# Patient Record
Sex: Female | Born: 1990 | Race: White | Hispanic: No | Marital: Married | Smoking: Never smoker
Health system: Southern US, Community
[De-identification: ages and names within clinical notes are randomized; demographics above are authoritative.]

## PROBLEM LIST (undated history)

## (undated) ENCOUNTER — Inpatient Hospital Stay (HOSPITAL_COMMUNITY): Payer: Self-pay

## (undated) ENCOUNTER — Ambulatory Visit (HOSPITAL_BASED_OUTPATIENT_CLINIC_OR_DEPARTMENT_OTHER): Payer: Self-pay

## (undated) DIAGNOSIS — Z789 Other specified health status: Secondary | ICD-10-CM

## (undated) HISTORY — DX: Other specified health status: Z78.9

## (undated) HISTORY — PX: OTHER SURGICAL HISTORY: SHX169

---

## 2014-04-08 DIAGNOSIS — O4590 Premature separation of placenta, unspecified, unspecified trimester: Secondary | ICD-10-CM

## 2015-12-12 ENCOUNTER — Telehealth: Payer: Self-pay

## 2015-12-12 NOTE — Telephone Encounter (Signed)
Patient is a new ob scheduled on 12-19-15 and called our office with some questions. Attempted to reach patient and no answer- left message for patient to return call to our office. Armandina StammerJennifer Lynnzie Blackson RN BSN

## 2015-12-12 NOTE — Telephone Encounter (Signed)
-  atient called and states she is pregnant. LMP01-19-17 (approx 9 weeks)  Patient states she had some light spotting on Friday and Saturday that was pink in color. Patient stated she did have some more spotting yesterday but it was more brownish in color.01- Patient-instructed to go to maternity admissions unit if bleeding continues for evaluation. Patient states understanding and has new ob appointment on Monday 12-19-15 in our clinic. Armandina StammerJennifer Nolberto Cheuvront RN BSN

## 2015-12-19 ENCOUNTER — Other Ambulatory Visit (HOSPITAL_COMMUNITY)
Admission: RE | Admit: 2015-12-19 | Discharge: 2015-12-19 | Disposition: A | Discharge status: Home / Self Care | Payer: Medicaid Other | Source: Ambulatory Visit | Attending: Obstetrics & Gynecology | Admitting: Obstetrics & Gynecology

## 2015-12-19 ENCOUNTER — Encounter: Payer: Self-pay | Admitting: Obstetrics & Gynecology

## 2015-12-19 ENCOUNTER — Ambulatory Visit (INDEPENDENT_AMBULATORY_CARE_PROVIDER_SITE_OTHER): Payer: Medicaid Other | Admitting: Obstetrics & Gynecology

## 2015-12-19 VITALS — BP 129/74 | HR 74 | Ht 68.0 in | Wt 170.0 lb

## 2015-12-19 DIAGNOSIS — O0991 Supervision of high risk pregnancy, unspecified, first trimester: Secondary | ICD-10-CM | POA: Diagnosis not present

## 2015-12-19 DIAGNOSIS — O0990 Supervision of high risk pregnancy, unspecified, unspecified trimester: Secondary | ICD-10-CM | POA: Insufficient documentation

## 2015-12-19 DIAGNOSIS — Z113 Encounter for screening for infections with a predominantly sexual mode of transmission: Secondary | ICD-10-CM | POA: Diagnosis present

## 2015-12-19 DIAGNOSIS — Z1151 Encounter for screening for human papillomavirus (HPV): Secondary | ICD-10-CM | POA: Diagnosis not present

## 2015-12-19 DIAGNOSIS — O34219 Maternal care for unspecified type scar from previous cesarean delivery: Secondary | ICD-10-CM | POA: Insufficient documentation

## 2015-12-19 DIAGNOSIS — Z01411 Encounter for gynecological examination (general) (routine) with abnormal findings: Secondary | ICD-10-CM | POA: Insufficient documentation

## 2015-12-19 DIAGNOSIS — Z36 Encounter for antenatal screening of mother: Secondary | ICD-10-CM

## 2015-12-19 DIAGNOSIS — O09299 Supervision of pregnancy with other poor reproductive or obstetric history, unspecified trimester: Secondary | ICD-10-CM | POA: Insufficient documentation

## 2015-12-19 DIAGNOSIS — O09291 Supervision of pregnancy with other poor reproductive or obstetric history, first trimester: Secondary | ICD-10-CM

## 2015-12-19 NOTE — Progress Notes (Signed)
  Subjective:    Erin BertholdChristina [name] is being seen today for her first obstetrical visit.  This is not a planned pregnancy. She is at 1262w4d gestation based on her LMP.  By Erin [name] (today she is 7 5/[redacted] weeks gestation. Her obstetrical history is significant for h/o placental abruption. . Relationship with FOB: pt planning to get married this weekend.  . Patient does intend to breast feed. Pregnancy history fully reviewed.  Patient reports spotting 2-3 days ago.  Light..  Review of Systems:   Review of Systems  Objective:     BP 129/74 mmHg  Pulse 74  Ht 5\' 8"  (1.727 m)  Wt 170 lb (77.111 kg)  BMI 25.85 kg/m2  LMP 10/06/2015 Physical Exam  Exam General Appearance:    Alert, cooperative, no distress, appears stated age  Head:    Normocephalic, without obvious abnormality, atraumatic  Eyes:    conjunctiva/corneas clear, EOM's intact, both eyes  Ears:    Normal external ear canals, both ears  Nose:   Nares normal, septum midline, mucosa normal, no drainage    or sinus tenderness  Throat:   Lips, mucosa, and tongue normal; teeth and gums normal  Neck:   Supple, symmetrical, trachea midline, no adenopathy;    thyroid:  no enlargement/tenderness/nodules  Back:     Symmetric, no curvature, ROM normal, no CVA tenderness  Lungs:     Clear to auscultation bilaterally, respirations unlabored  Chest Wall:    No tenderness or deformity   Heart:    Regular rate and rhythm, S1 and S2 normal, no murmur, rub   or gallop  Breast Exam:    No tenderness, masses, or nipple abnormality  Abdomen:     Soft, non-tender, bowel sounds active all four quadrants,    no masses, no organomegaly  Genitalia:    Normal female without lesion, discharge or tenderness; 8 weeks sized uterus     Extremities:   Extremities normal, atraumatic, no cyanosis or edema  Pulses:   2+ and symmetric all extremities  Skin:   Skin color, texture, turgor normal, no rashes or lesions      Assessment:   IUP at 7 5/[redacted] weeks  gestation Patient Active Problem List   Diagnosis Date Noted  . Supervision of high risk pregnancy, antepartum 12/19/2015  . Previous cesarean delivery, antepartum 12/19/2015  . Neonatal death in prior pregnancy, currently pregnant 12/19/2015      Plan:     Initial labs drawn. Prenatal vitamins. Problem list reviewed and updated. First trimester screening requested and ordered AFP3 discussed: requested.  Role of ultrasound in pregnancy discussed; fetal survey: requested. Amniocentesis discussed: not indicated. Follow up in 5 weeks. 50% of 40 min visit spent on counseling and coordination of care.  Need records for pt and autopsy info for [place] (1st child that died at 30 min of life)   [name], Erin Fariss 12/19/2015

## 2015-12-19 NOTE — Progress Notes (Signed)
Bedside ultrasound revealed 7 week 5 day pregnancy [facility] FHR on ultrasound is 164 bpm. Armandina StammerJennifer Howard RN BSN

## 2015-12-19 NOTE — Patient Instructions (Signed)
Vaginal Bleeding During Pregnancy, First Trimester °A small amount of bleeding (spotting) from the vagina is relatively common in early pregnancy. It usually stops on its own. Various things may cause bleeding or spotting in early pregnancy. Some bleeding may be related to the pregnancy, and some may not. In most cases, the bleeding is normal and is not a problem. However, bleeding can also be a sign of something serious. Be sure to tell your health care provider about any vaginal bleeding right away. °Some possible causes of vaginal bleeding during the first trimester include: °· Infection or inflammation of the cervix. °· Growths (polyps) on the cervix. °· Miscarriage or threatened miscarriage. °· Pregnancy tissue has developed outside of the uterus and in a fallopian tube (tubal pregnancy). °· Tiny cysts have developed in the uterus instead of pregnancy tissue (molar pregnancy). °HOME CARE INSTRUCTIONS  °Watch your condition for any changes. The following actions may help to lessen any discomfort you are feeling: °· Follow your health care provider's instructions for limiting your activity. If your health care provider orders bed rest, you may need to stay in bed and only get up to use the bathroom. However, your health care provider may allow you to continue light activity. °· If needed, make plans for someone to help with your regular activities and responsibilities while you are on bed rest. °· Keep track of the number of pads you use each day, how often you change pads, and how soaked (saturated) they are. Write this down. °· Do not use tampons. Do not douche. °· Do not have sexual intercourse or orgasms until approved by your health care provider. °· If you pass any tissue from your vagina, save the tissue so you can show it to your health care provider. °· Only take over-the-counter or prescription medicines as directed by your health care provider. °· Do not take aspirin because it can make you  bleed. °· Keep all follow-up appointments as directed by your health care provider. °SEEK MEDICAL CARE IF: °· You have any vaginal bleeding during any part of your pregnancy. °· You have cramps or labor pains. °· You have a fever, not controlled by medicine. °SEEK IMMEDIATE MEDICAL CARE IF:  °· You have severe cramps in your back or belly (abdomen). °· You pass large clots or tissue from your vagina. °· Your bleeding increases. °· You feel light-headed or weak, or you have fainting episodes. °· You have chills. °· You are leaking fluid or have a gush of fluid from your vagina. °· You pass out while having a bowel movement. °MAKE SURE YOU: °· Understand these instructions. °· Will watch your condition. °· Will get help right away if you are not doing well or get worse. °  °This information is not intended to replace advice given to you by your health care provider. Make sure you discuss any questions you have with your health care provider. °  °Document Released: 06/13/2005 Document Revised: 09/08/2013 Document Reviewed: 05/11/2013 °Elsevier Interactive Patient Education ©2016 Else[employer] ° ° °First Trimester of Pregnancy °The first trimester of pregnancy is from week 1 until the end of week 12 (months 1 through 3). A week after a sperm fertilizes an egg, the egg will implant on the wall of the uterus. This embryo will begin to develop into a baby. Genes from you and your partner are forming the baby. The female genes determine whether the baby is a boy or a girl. At 6-8 weeks, the eyes and   formed, and the heartbeat can be seen on ultrasound. At the end of 12 weeks, all the baby's organs are formed.  Now that you are pregnant, you will want to do everything you can to have a healthy baby. Two of the most important things are to get good prenatal care and to follow your health care provider's instructions. Prenatal care is all the medical care you receive before the baby's birth. This care will help prevent,  find, and treat any problems during the pregnancy and childbirth. BODY CHANGES Your body goes through many changes during pregnancy. The changes vary from woman to woman.   You may gain or lose a couple of pounds at first.  You may feel sick to your stomach (nauseous) and throw up (vomit). If the vomiting is uncontrollable, call your health care provider.  You may tire easily.  You may develop headaches that can be relieved by medicines approved by your health care provider.  You may urinate more often. Painful urination may mean you have a bladder infection.  You may develop heartburn as a result of your pregnancy.  You may develop constipation because certain hormones are causing the muscles that push waste through your intestines to slow down.  You may develop hemorrhoids or swollen, bulging veins (varicose veins).  Your breasts may begin to grow larger and become tender. Your nipples may stick out more, and the tissue that surrounds them (areola) may become darker.  Your gums may bleed and may be sensitive to brushing and flossing.  Dark spots or blotches (chloasma, mask of pregnancy) may develop on your face. This will likely fade after the baby is born.  Your menstrual periods will stop.  You may have a loss of appetite.  You may develop cravings for certain kinds of food.  You may have changes in your emotions from day to day, such as being excited to be pregnant or being concerned that something may go wrong with the pregnancy and baby.  You may have more vivid and strange dreams.  You may have changes in your hair. These can include thickening of your hair, rapid growth, and changes in texture. Some women also have hair loss during or after pregnancy, or hair that feels dry or thin. Your hair will most likely return to normal after your baby is born. WHAT TO EXPECT AT YOUR PRENATAL VISITS During a routine prenatal visit:  You will be weighed to make sure you and the  baby are growing normally.  Your blood pressure will be taken.  Your abdomen will be measured to track your baby's growth.  The fetal heartbeat will be listened to starting around week 10 or 12 of your pregnancy.  Test results from any previous visits will be discussed. Your health care provider may ask you:  How you are feeling.  If you are feeling the baby move.  If you have had any abnormal symptoms, such as leaking fluid, bleeding, severe headaches, or abdominal cramping.  If you are using any tobacco products, including cigarettes, chewing tobacco, and electronic cigarettes.  If you have any questions. Other tests that may be performed during your first trimester include:  Blood tests to find your blood type and to check for the presence of any previous infections. They will also be used to check for low iron levels (anemia) and Rh antibodies. Later in the pregnancy, blood tests for diabetes will be done along with other tests if problems develop.  Urine tests to check  to check for infections, diabetes, or protein in the urine. °· An ultrasound to confirm the proper growth and development of the baby. °· An amniocentesis to check for possible genetic problems. °· Fetal screens for spina bifida and Down syndrome. °· You may need other tests to make sure you and the baby are doing well. °· HIV (human immunodeficiency virus) testing. Routine prenatal testing includes screening for HIV, unless you choose not to have this test. °HOME CARE INSTRUCTIONS  °Medicines °· Follow your health care provider's instructions regarding medicine use. Specific medicines may be either safe or unsafe to take during pregnancy. °· Take your prenatal vitamins as directed. °· If you develop constipation, try taking a stool softener if your health care provider approves. °Diet °· Eat regular, well-balanced meals. Choose a variety of foods, such as meat or vegetable-based protein, fish, milk and low-fat dairy products,  vegetables, fruits, and whole grain breads and cereals. Your health care provider will help you determine the amount of weight gain that is right for you. °· Avoid raw meat and uncooked cheese. These carry germs that can cause birth defects in the baby. °· Eating four or five small meals rather than three large meals a day may help relieve nausea and vomiting. If you start to feel nauseous, eating a few soda crackers can be helpful. Drinking liquids between meals instead of during meals also seems to help nausea and vomiting. °· If you develop constipation, eat more high-fiber foods, such as fresh vegetables or fruit and whole grains. Drink enough fluids to keep your urine clear or pale yellow. °Activity and Exercise °· Exercise only as directed by your health care provider. Exercising will help you: °¨ Control your weight. °¨ Stay in shape. °¨ Be prepared for labor and delivery. °· Experiencing pain or cramping in the lower abdomen or low back is a good sign that you should stop exercising. Check with your health care provider before continuing normal exercises. °· Try to avoid standing for long periods of time. Move your legs often if you must stand in one place for a long time. °· Avoid heavy lifting. °· Wear low-heeled shoes, and practice good posture. °· You may continue to have sex unless your health care provider directs you otherwise. °Relief of Pain or Discomfort °· Wear a good support bra for breast tenderness.   °· Take warm sitz baths to soothe any pain or discomfort caused by hemorrhoids. Use hemorrhoid cream if your health care provider approves.   °· Rest with your legs elevated if you have leg cramps or low back pain. °· If you develop varicose veins in your legs, wear support hose. Elevate your feet for 15 minutes, 3-4 times a day. Limit salt in your diet. °Prenatal Care °· Schedule your prenatal visits by the twelfth week of pregnancy. They are usually scheduled monthly at first, then more often in  the last 2 months before delivery. °· Write down your questions. Take them to your prenatal visits. °· Keep all your prenatal visits as directed by your health care provider. °Safety °· Wear your seat belt at all times when driving. °· Make a list of emergency phone numbers, including numbers for family, friends, the hospital, and police and fire departments. °General Tips °· Ask your health care provider for a referral to a local prenatal education class. Begin classes no later than at the beginning of month 6 of your pregnancy. °· Ask for help if you have counseling or nutritional needs during pregnancy.   Your health care provider can offer advice or refer you to specialists for help with various needs. °· Do not use hot tubs, steam rooms, or saunas. °· Do not douche or use tampons or scented sanitary pads. °· Do not cross your legs for long periods of time. °· Avoid cat litter boxes and soil used by cats. These carry germs that can cause birth defects in the baby and possibly loss of the fetus by miscarriage or stillbirth. °· Avoid all smoking, herbs, alcohol, and medicines not prescribed by your health care provider. Chemicals in these affect the formation and growth of the baby. °· Do not use any tobacco products, including cigarettes, chewing tobacco, and electronic cigarettes. If you need help quitting, ask your health care provider. You may receive counseling support and other resources to help you quit. °· Schedule a dentist appointment. At home, brush your teeth with a soft toothbrush and be gentle when you floss. °SEEK MEDICAL CARE IF:  °· You have dizziness. °· You have mild pelvic cramps, pelvic pressure, or nagging pain in the abdominal area. °· You have persistent nausea, vomiting, or diarrhea. °· You have a bad smelling vaginal discharge. °· You have pain with urination. °· You notice increased swelling in your face, hands, legs, or ankles. °SEEK IMMEDIATE MEDICAL CARE IF:  °· You have a fever. °· You  are leaking fluid from your vagina. °· You have spotting or bleeding from your vagina. °· You have severe abdominal cramping or pain. °· You have rapid weight gain or loss. °· You vomit blood or material that looks like coffee grounds. °· You are exposed to German measles and have never had them. °· You are exposed to fifth disease or chickenpox. °· You develop a severe headache. °· You have shortness of breath. °· You have any kind of trauma, such as from a fall or a car accident. °  °This information is not intended to replace advice given to you by your health care provider. Make sure you discuss any questions you have with your health care provider. °  °Document Released: 08/28/2001 Document Revised: 09/24/2014 Document Reviewed: 07/14/2013 °Elsevier Interactive Patient Education ©2016 Else[employer] Inc. ° °

## 2015-12-19 NOTE — Progress Notes (Signed)
Patient presents for New OB visit.  

## 2015-12-20 ENCOUNTER — Encounter: Payer: Self-pay | Admitting: Obstetrics & Gynecology

## 2015-12-20 DIAGNOSIS — O26899 Other specified pregnancy related conditions, unspecified trimester: Secondary | ICD-10-CM | POA: Insufficient documentation

## 2015-12-20 DIAGNOSIS — Z6791 Unspecified blood type, Rh negative: Secondary | ICD-10-CM

## 2015-12-20 LAB — OBSTETRIC PANEL
Antibody Screen: NEGATIVE
BASOS ABS: 0 {cells}/uL (ref 0–200)
BASOS PCT: 0 %
EOS ABS: 59 {cells}/uL (ref 15–500)
Eosinophils Relative: 1 %
HCT: 39.2 % (ref 35.0–45.0)
HEMOGLOBIN: 12.9 g/dL (ref 11.7–15.5)
HEP B S AG: NEGATIVE
LYMPHS ABS: 2537 {cells}/uL (ref 850–3900)
Lymphocytes Relative: 43 %
MCH: 28.6 pg (ref 27.0–33.0)
MCHC: 32.9 g/dL (ref 32.0–36.0)
MCV: 86.9 fL (ref 80.0–100.0)
MONO ABS: 472 {cells}/uL (ref 200–950)
MPV: 9.6 fL (ref 7.5–12.5)
Monocytes Relative: 8 %
NEUTROS ABS: 2832 {cells}/uL (ref 1500–7800)
Neutrophils Relative %: 48 %
Platelets: 264 10*3/uL (ref 140–400)
RBC: 4.51 MIL/uL (ref 3.80–5.10)
RDW: 14.2 % (ref 11.0–15.0)
RUBELLA: 1.31 {index} — AB (ref <0.90)
Rh Type: NEGATIVE
WBC: 5.9 10*3/uL (ref 3.8–10.8)

## 2015-12-20 LAB — HIV ANTIBODY (ROUTINE TESTING W REFLEX): HIV: NONREACTIVE

## 2015-12-20 LAB — CULTURE, URINE COMPREHENSIVE
Colony Count: NO GROWTH
Organism ID, Bacteria: NO GROWTH

## 2015-12-21 LAB — CYTOLOGY - PAP

## 2015-12-27 LAB — CYSTIC FIBROSIS DIAGNOSTIC STUDY

## 2016-01-13 ENCOUNTER — Encounter (HOSPITAL_COMMUNITY): Payer: Self-pay | Admitting: Obstetrics & Gynecology

## 2016-01-19 ENCOUNTER — Telehealth: Payer: Self-pay | Admitting: Family Medicine

## 2016-01-19 NOTE — Telephone Encounter (Signed)
[facility] [place] [facility] here [facility], She is calling questioning why she have this appt and why do she have to come here if [facility] is [facility]

## 2016-01-23 ENCOUNTER — Ambulatory Visit (INDEPENDENT_AMBULATORY_CARE_PROVIDER_SITE_OTHER): Payer: Medicaid Other | Admitting: Obstetrics & Gynecology

## 2016-01-23 ENCOUNTER — Ambulatory Visit: Payer: Medicaid Other | Admitting: Obstetrics & Gynecology

## 2016-01-23 VITALS — BP 129/67 | HR 77 | Wt 172.0 lb

## 2016-01-23 VITALS — Wt 172.1 lb

## 2016-01-23 DIAGNOSIS — IMO0002 Reserved for concepts with insufficient information to code with codable children: Secondary | ICD-10-CM

## 2016-01-23 DIAGNOSIS — O0991 Supervision of high risk pregnancy, unspecified, first trimester: Secondary | ICD-10-CM

## 2016-01-23 NOTE — Progress Notes (Signed)
Patient ID: Erin [name], female   DOB: [birth year], 25 y.o.   MRN: [id number] Subjective:scheduled for colposcopy  Erin [name] is a 25 y.o. G2P1000 at 5530w5d being seen today for ongoing prenatal care.  She is currently monitored for the following issues for this high-risk pregnancy and has Supervision of high risk pregnancy, antepartum; Previous cesarean delivery, antepartum; Neonatal death in prior pregnancy, currently pregnant; Rh negative state in antepartum period; and ASCUS with positive high risk HPV on her problem list.  Patient reports no complaints.   .  .   . Denies leaking of fluid.   The following portions of the patient's history were reviewed and updated as appropriate: allergies, current medications, past family history, past medical history, past social history, past surgical history and problem list. Problem list updated.  Objective:   Filed Vitals:   01/23/16 1446  Weight: 172 lb 1.6 oz (78.064 kg)    Fetal Status:           General:  Alert, oriented and cooperative. Patient is in no acute distress.  Skin: Skin is warm and dry. No rash noted.   Cardiovascular: Normal heart rate noted  Respiratory: Normal respiratory effort, no problems with respiration noted  Abdomen: Soft, gravid, appropriate for gestational age.       Pelvic:       Cervical exam deferred        Extremities: Normal range of motion.     Mental Status: Normal mood and affect. Normal behavior. Normal judgment and thought content.   Urinalysis:      Assessment and Plan:  Pregnancy: G2P1000 at 5230w5d  1. ASCUS with positive high risk HPV Repeat pap and cotesting at 12 mo  2. Supervision of high risk pregnancy, antepartum, first trimester Has first screen scheduled  Preterm labor symptoms and general obstetric precautions including but not limited to vaginal bleeding, contractions, leaking of fluid and fetal movement were reviewed in detail with the patient. Please refer to After Visit  Summary for other counseling recommendations.  Return in about 4 weeks (around 02/20/2016).   Adam PhenixJames G Arnold, MD

## 2016-01-23 NOTE — Telephone Encounter (Signed)
Spoke with patient who [facility]. She stated no one has called her to inform her of this information. I discuss with patient the procedure would consist of and recommended she called [place] to discuss moving her appt. Pt verbalize understanding and will follow up with the [facility].

## 2016-01-23 NOTE — Patient Instructions (Signed)

## 2016-01-25 ENCOUNTER — Encounter: Payer: Medicaid Other | Admitting: Family Medicine

## 2016-01-27 ENCOUNTER — Ambulatory Visit (HOSPITAL_COMMUNITY): Admission: RE | Admit: 2016-01-27 | Payer: Medicaid Other | Source: Ambulatory Visit

## 2016-01-27 ENCOUNTER — Encounter (HOSPITAL_COMMUNITY): Payer: Self-pay

## 2016-01-27 ENCOUNTER — Ambulatory Visit (HOSPITAL_COMMUNITY)
Admission: RE | Admit: 2016-01-27 | Discharge: 2016-01-27 | Disposition: A | Discharge status: Home / Self Care | Payer: Medicaid Other | Source: Ambulatory Visit | Attending: Obstetrics & Gynecology | Admitting: Obstetrics & Gynecology

## 2016-01-27 VITALS — BP 126/64 | HR 68 | Wt 175.4 lb

## 2016-01-27 DIAGNOSIS — O09291 Supervision of pregnancy with other poor reproductive or obstetric history, first trimester: Secondary | ICD-10-CM | POA: Diagnosis not present

## 2016-01-27 DIAGNOSIS — O34219 Maternal care for unspecified type scar from previous cesarean delivery: Secondary | ICD-10-CM | POA: Diagnosis not present

## 2016-01-27 DIAGNOSIS — Z36 Encounter for antenatal screening of mother: Secondary | ICD-10-CM | POA: Insufficient documentation

## 2016-01-27 DIAGNOSIS — O0991 Supervision of high risk pregnancy, unspecified, first trimester: Secondary | ICD-10-CM

## 2016-01-27 DIAGNOSIS — Z3A13 13 weeks gestation of pregnancy: Secondary | ICD-10-CM | POA: Diagnosis not present

## 2016-01-30 ENCOUNTER — Other Ambulatory Visit (HOSPITAL_COMMUNITY): Payer: Self-pay | Admitting: *Deleted

## 2016-01-30 DIAGNOSIS — O09299 Supervision of pregnancy with other poor reproductive or obstetric history, unspecified trimester: Secondary | ICD-10-CM

## 2016-02-21 ENCOUNTER — Ambulatory Visit (INDEPENDENT_AMBULATORY_CARE_PROVIDER_SITE_OTHER): Payer: PRIVATE HEALTH INSURANCE | Admitting: Student

## 2016-02-21 VITALS — BP 118/74 | HR 64 | Wt 177.0 lb

## 2016-02-21 DIAGNOSIS — O0992 Supervision of high risk pregnancy, unspecified, second trimester: Secondary | ICD-10-CM

## 2016-02-21 DIAGNOSIS — O34219 Maternal care for unspecified type scar from previous cesarean delivery: Secondary | ICD-10-CM

## 2016-02-21 LAB — POCT URINALYSIS DIP (DEVICE)
Bilirubin Urine: NEGATIVE
GLUCOSE, UA: NEGATIVE mg/dL
HGB URINE DIPSTICK: NEGATIVE
Ketones, ur: NEGATIVE mg/dL
LEUKOCYTES UA: NEGATIVE
NITRITE: NEGATIVE
PROTEIN: NEGATIVE mg/dL
Specific Gravity, Urine: 1.025 (ref 1.005–1.030)
UROBILINOGEN UA: 0.2 mg/dL (ref 0.0–1.0)
pH: 6 (ref 5.0–8.0)

## 2016-02-21 LAB — CBC
HCT: 37.2 % (ref 35.0–45.0)
Hemoglobin: 12.5 g/dL (ref 11.7–15.5)
MCH: 29.3 pg (ref 27.0–33.0)
MCHC: 33.6 g/dL (ref 32.0–36.0)
MCV: 87.3 fL (ref 80.0–100.0)
MPV: 9.8 fL (ref 7.5–12.5)
PLATELETS: 232 10*3/uL (ref 140–400)
RBC: 4.26 MIL/uL (ref 3.80–5.10)
RDW: 13.6 % (ref 11.0–15.0)
WBC: 7.6 10*3/uL (ref 3.8–10.8)

## 2016-02-21 NOTE — Progress Notes (Signed)
Pt reports episodes of feeling very hot and then she gets faint. She has not fainted just feels as though she will.

## 2016-02-22 ENCOUNTER — Telehealth: Payer: Self-pay | Admitting: *Deleted

## 2016-02-22 NOTE — Telephone Encounter (Signed)
Called patient and left message that I'm calling with results.  

## 2016-02-22 NOTE — Progress Notes (Signed)
Subjective:  Erin [name] is a 25 y.o. G2P1000 at 625w0d being seen today for ongoing prenatal care.  She is currently monitored for the following issues for this high-risk pregnancy and has Supervision of high risk pregnancy, antepartum; Previous cesarean delivery, antepartum; Neonatal death in prior pregnancy, currently pregnant; Rh negative state in antepartum period; and ASCUS with positive high risk HPV on her problem list.  Patient reports presyncopal episodes. States she occaionally feels like things are about to go black. Episodes are preceded by whole body feeling hot. Denies syncope. Symtpoms improve if she sits down and only last a few seconds at a time. Denies hx of this prior to pregnancy; denies hx of seizures, cardiac issues, or hypoglycemia. .  Contractions: Not present. Vag. Bleeding: None.  Movement: Absent. Denies leaking of fluid.   The following portions of the patient's history were reviewed and updated as appropriate: allergies, current medications, past family history, past medical history, past social history, past surgical history and problem list. Problem list updated.  Objective:   Filed Vitals:   02/21/16 1537  BP: 118/74  Pulse: 64  Weight: 177 lb (80.287 kg)    Fetal Status: Fetal Heart Rate (bpm): 150 Fundal Height: 18 cm Movement: Absent     General:  Alert, oriented and cooperative. Patient is in no acute distress.  Skin: Skin is warm and dry. No rash noted.   Cardiovascular: Normal heart rate noted  Respiratory: Normal respiratory effort, no problems with respiration noted  Abdomen: Soft, gravid, appropriate for gestational age. Pain/Pressure: Absent     Pelvic: Vag. Bleeding: None     Cervical exam deferred        Extremities: Normal range of motion.  Edema: None  Mental Status: Normal mood and affect. Normal behavior. Normal judgment and thought content.   Urinalysis:      Assessment and Plan:  Pregnancy: G2P1000 at 635w0d  1. Supervision of  high risk pregnancy, antepartum, second trimester  - CBC  Preterm labor symptoms and general obstetric precautions including but not limited to vaginal bleeding, contractions, leaking of fluid and fetal movement were reviewed in detail with the patient. Discussed eating small meals every 3 hours to maintain blood sugars. If symptoms worsen or she does pass out, patient knows purpose of MAU.  Please refer to After Visit Summary for other counseling recommendations.  No Follow-up on file.   Judeth HornErin Chapel Silver[place]orn, NP

## 2016-02-22 NOTE — Telephone Encounter (Signed)
-----   Message from Judeth HornErin Lawrence, NP sent at 02/22/2016  8:08 AM EDT ----- Please inform patient her hemoglobin is stable & she's not anemic. Discussed with her interventions to help with her symptoms. If things get worse or she does pass out she should go to MAU. Thanks!

## 2016-02-22 NOTE — Patient Instructions (Signed)

## 2016-02-23 ENCOUNTER — Telehealth: Payer: Self-pay | Admitting: General Practice

## 2016-02-23 ENCOUNTER — Encounter: Payer: Self-pay | Admitting: General Practice

## 2016-02-23 NOTE — Telephone Encounter (Signed)
Patient called into front office & I informed her of results. Patient verbalized understanding & had no questions

## 2016-02-23 NOTE — Telephone Encounter (Signed)
Called patient, no answer- left message stating we are trying to reach you with non urgent results, please call us back at the clinics. Will send letter 

## 2016-03-09 ENCOUNTER — Encounter (HOSPITAL_COMMUNITY): Payer: Self-pay

## 2016-03-09 ENCOUNTER — Other Ambulatory Visit (HOSPITAL_COMMUNITY): Payer: Self-pay | Admitting: Maternal and Fetal Medicine

## 2016-03-09 ENCOUNTER — Ambulatory Visit (HOSPITAL_COMMUNITY)
Admission: RE | Admit: 2016-03-09 | Discharge: 2016-03-09 | Disposition: A | Discharge status: Home / Self Care | Payer: Medicaid Other | Source: Ambulatory Visit | Attending: Obstetrics & Gynecology | Admitting: Obstetrics & Gynecology

## 2016-03-09 VITALS — BP 115/62 | HR 64 | Wt 179.4 lb

## 2016-03-09 DIAGNOSIS — O09292 Supervision of pregnancy with other poor reproductive or obstetric history, second trimester: Secondary | ICD-10-CM | POA: Insufficient documentation

## 2016-03-09 DIAGNOSIS — O34219 Maternal care for unspecified type scar from previous cesarean delivery: Secondary | ICD-10-CM | POA: Diagnosis not present

## 2016-03-09 DIAGNOSIS — Z3689 Encounter for other specified antenatal screening: Secondary | ICD-10-CM

## 2016-03-09 DIAGNOSIS — Z36 Encounter for antenatal screening of mother: Secondary | ICD-10-CM | POA: Insufficient documentation

## 2016-03-09 DIAGNOSIS — Z3A19 19 weeks gestation of pregnancy: Secondary | ICD-10-CM | POA: Insufficient documentation

## 2016-03-09 DIAGNOSIS — O09299 Supervision of pregnancy with other poor reproductive or obstetric history, unspecified trimester: Secondary | ICD-10-CM

## 2016-03-09 DIAGNOSIS — O0990 Supervision of high risk pregnancy, unspecified, unspecified trimester: Secondary | ICD-10-CM

## 2016-03-22 ENCOUNTER — Ambulatory Visit (INDEPENDENT_AMBULATORY_CARE_PROVIDER_SITE_OTHER): Payer: Medicaid Other | Admitting: Obstetrics & Gynecology

## 2016-03-22 VITALS — BP 134/80 | HR 70 | Wt 187.0 lb

## 2016-03-22 DIAGNOSIS — O34219 Maternal care for unspecified type scar from previous cesarean delivery: Secondary | ICD-10-CM | POA: Diagnosis not present

## 2016-03-22 DIAGNOSIS — O0992 Supervision of high risk pregnancy, unspecified, second trimester: Secondary | ICD-10-CM | POA: Diagnosis present

## 2016-03-22 LAB — POCT URINALYSIS DIP (DEVICE)
Bilirubin Urine: NEGATIVE
Glucose, UA: NEGATIVE mg/dL
Ketones, ur: NEGATIVE mg/dL
Nitrite: NEGATIVE
PH: 6 (ref 5.0–8.0)
PROTEIN: NEGATIVE mg/dL
SPECIFIC GRAVITY, URINE: 1.02 (ref 1.005–1.030)
UROBILINOGEN UA: 0.2 mg/dL (ref 0.0–1.0)

## 2016-03-22 NOTE — Progress Notes (Signed)
  Subjective:  Erin BertholdChristina [name] is a 25 y.o. G2P1000 at 7424w1d being seen today for ongoing prenatal care.  She is currently monitored for the following issues for this low-risk pregnancy and has Supervision of high risk pregnancy, antepartum; Previous cesarean delivery, antepartum; Neonatal death in prior pregnancy, currently pregnant; Rh negative state in antepartum period; and ASCUS with positive high risk HPV on her problem list.  Patient reports no contractions.  Contractions: Not present. Vag. Bleeding: None.  Movement: Present. Denies leaking of fluid.   The following portions of the patient's history were reviewed and updated as appropriate: allergies, current medications, past family history, past medical history, past social history, past surgical history and problem list. Problem list updated.  Objective:   Filed Vitals:   03/22/16 0804  BP: 134/80  Pulse: 70  Weight: 187 lb (84.823 kg)    Fetal Status: Fetal Heart Rate (bpm): 152 Fundal Height: 21 cm Movement: Present     General:  Alert, oriented and cooperative. Patient is in no acute distress.  Skin: Skin is warm and dry. No rash noted.   Cardiovascular: Normal heart rate noted  Respiratory: Normal respiratory effort, no problems with respiration noted  Abdomen: Soft, gravid, appropriate for gestational age. Pain/Pressure: Present     Pelvic:  Cervical exam deferred        Extremities: Normal range of motion.  Edema: None  Mental Status: Normal mood and affect. Normal behavior. Normal judgment and thought content.   Urinalysis: Urine Protein: Negative Urine Glucose: Negative  Assessment and Plan:  Pregnancy: G2P1000 at 5424w1d  1. Supervision of high risk pregnancy, antepartum, second trimester H/o neonatal death  Preterm labor symptoms and general obstetric precautions including but not limited to vaginal bleeding, contractions, leaking of fluid and fetal movement were reviewed in detail with the patient. Please  refer to After Visit Summary for other counseling recommendations.  Return in about 4 weeks (around 04/19/2016).   Adam PhenixJames G Arnold, MD

## 2016-03-22 NOTE — Patient Instructions (Signed)

## 2016-03-31 ENCOUNTER — Inpatient Hospital Stay (HOSPITAL_COMMUNITY)
Admission: AD | Admit: 2016-03-31 | Discharge: 2016-03-31 | Disposition: A | Discharge status: Home / Self Care | Payer: Medicaid Other | Source: Ambulatory Visit | Attending: Obstetrics & Gynecology | Admitting: Obstetrics & Gynecology

## 2016-03-31 ENCOUNTER — Encounter (HOSPITAL_COMMUNITY): Payer: Self-pay | Admitting: *Deleted

## 2016-03-31 DIAGNOSIS — Z882 Allergy status to sulfonamides status: Secondary | ICD-10-CM | POA: Diagnosis not present

## 2016-03-31 DIAGNOSIS — Z3A22 22 weeks gestation of pregnancy: Secondary | ICD-10-CM | POA: Diagnosis not present

## 2016-03-31 DIAGNOSIS — N898 Other specified noninflammatory disorders of vagina: Secondary | ICD-10-CM | POA: Insufficient documentation

## 2016-03-31 DIAGNOSIS — O26892 Other specified pregnancy related conditions, second trimester: Secondary | ICD-10-CM | POA: Insufficient documentation

## 2016-03-31 DIAGNOSIS — O34219 Maternal care for unspecified type scar from previous cesarean delivery: Secondary | ICD-10-CM

## 2016-03-31 LAB — WET PREP, GENITAL
CLUE CELLS WET PREP: NONE SEEN
Sperm: NONE SEEN
TRICH WET PREP: NONE SEEN
YEAST WET PREP: NONE SEEN

## 2016-03-31 LAB — URINALYSIS, ROUTINE W REFLEX MICROSCOPIC
BILIRUBIN URINE: NEGATIVE
Glucose, UA: NEGATIVE mg/dL
Hgb urine dipstick: NEGATIVE
KETONES UR: NEGATIVE mg/dL
LEUKOCYTES UA: NEGATIVE
NITRITE: NEGATIVE
PH: 6.5 (ref 5.0–8.0)
PROTEIN: NEGATIVE mg/dL
Specific Gravity, Urine: <1.005 — ABNORMAL LOW (ref 1.005–1.030)

## 2016-03-31 LAB — POCT FERN TEST: POCT FERN TEST: NEGATIVE

## 2016-03-31 NOTE — Discharge Instructions (Signed)
Premature Rupture and Preterm Premature Rupture of Membranes °Premature rupture of membranes (PROM) is when the membranes (amniotic sac) break open before contractions or labor starts. Rupture of membranes is commonly referred to as your water breaking. If PROM occurs before 37 weeks of pregnancy, it is called preterm premature rupture of membranes (PPROM). The amniotic sac holds the fetus, keeps infection out, and performs other important functions. Having the amniotic sac rupture before 37 weeks of pregnancy can lead to serious problems and requires immediate attention by your health care provider. °CAUSES  °PROM near the end of the pregnancy may be caused by natural weakening of the membranes. PPROM is often due to an infection. Other factors that may be associated with PROM include: °· Stretching of the amniotic sac because of carrying multiples or having too much amniotic fluid. °· Trauma. °· Smoking during pregnancy. °· Poor nutrition. °· Previous preterm birth. °· Vaginal bleeding. °· Little to no prenatal care. °· Problems with the placenta, such as placenta previa or placental abruption. °RISKS OF PROM AND PPROM °· Delivering a premature baby. °· Getting a serious infection of the placental tissues (chorioamnionitis). °· Early detachment of the placenta from the uterus (placental abruption). °· Compression of the umbilical cord. °· Needing a cesarean birth. °· Developing a serious infection after delivery. °SIGNS OF PROM OR PPROM  °· A sudden gush or slow leaking of fluid from the vagina. °· Constant wet underwear. °Sometimes, women mistake the leaking or wetness for urine, especially if the leak is slow and not a gush of fluid. If there is constant leaking or your underwear continues to get wet, your membranes have likely ruptured. °WHAT TO DO IF YOU THINK YOUR MEMBRANES HAVE RUPTURED °Call your health care provider right away. You will need to go to the hospital to get checked immediately. °WHAT HAPPENS  IF YOU ARE DIAGNOSED WITH PROM OR PPROM? °Once you arrive at the hospital, you will have tests done. A cervical exam will be performed to check if the cervix has softened or started to open (dilate). If you are diagnosed with PROM, you may be induced within 24 hours if you are not having contractions. If you are diagnosed with PPROM and are not having contractions, you may be induced depending on your trimester.  °If you have PPROM, you: °· And your baby will be monitored closely for signs of infection or other complications. °· May be given an antibiotic medicine to lower the chances of an infection developing. °· May be given a steroid medicine to help mature the baby's lungs faster. °· May be given a medicine to stop preterm labor. °· May be ordered to be on bed rest at home or in the hospital. °· May be induced if complications arise for you or the baby. °Your treatment will depend on many factors, such as how far along you are, the development of the baby, and other complications that may arise. °  °This information is not intended to replace advice given to you by your health care provider. Make sure you discuss any questions you have with your health care provider. °  °Document Released: 09/03/2005 Document Revised: 06/24/2013 Document Reviewed: 12/23/2012 °Elsevier Interactive Patient Education ©2016 Else[employer] Inc[employer] ° °

## 2016-03-31 NOTE — MAU Provider Note (Signed)
MAU HISTORY AND PHYSICAL  Chief Complaint:  Leakage of fluid  Erin [name] is a 25 y.o.  G2P1000 with IUP at 324w3d presenting for the above.  Has had increased discharge for few weeks but yesterday and today much more. "almost like i've peed myself." enough to soak through pants. No bleeding. No contractions. No fevers. No dysuria. No hematuria. No burning or itching or pain. No recent sex.  History reviewed. No pertinent past medical history.  Past Surgical History  Procedure Laterality Date  . Cesarean section    . Egg donor x2       History reviewed. No pertinent family history.  Social History  Substance Use Topics  . Smoking status: Never Smoker   . Smokeless tobacco: Never Used  . Alcohol Use: No    Allergies  Allergen Reactions  . Sulfa Antibiotics     Prescriptions prior to admission  Medication Sig Dispense Refill Last Dose  . prenatal vitamin w/FE, FA (PRENATAL 1 + 1) 27-1 MG TABS tablet Take 1 tablet by mouth daily at 12 noon.   Taking    Review of Systems - Negative except for what is mentioned in HPI.  Physical Exam  Temperature 98.5 F (36.9 C), temperature source Oral, last menstrual period 10/06/2015. GENERAL: Well-developed, well-nourished female in no acute distress.  LUNGS: Clear to auscultation bilaterally.  HEART: Regular rate and rhythm. ABDOMEN: Soft, nontender, nondistended, gravid.  EXTREMITIES: Nontender, no edema, 2+ distal pulses. GU: white thick discharge, normal cervix, no pooling, no fluid exiting the external os with valsalva FHT:  150s   Labs: Results for orders placed or performed during the hospital encounter of 03/31/16 (from the past 24 hour(s))  Urinalysis, Routine w reflex microscopic (not [facility])   Collection Time: 03/31/16  1:34 PM  Result Value Ref Range   Color, Urine YELLOW YELLOW   APPearance CLEAR CLEAR   Specific Gravity, Urine <1.005 (L) 1.005 - 1.030   pH 6.5 5.0 - 8.0   Glucose, UA NEGATIVE NEGATIVE mg/dL    Hgb urine dipstick NEGATIVE NEGATIVE   Bilirubin Urine NEGATIVE NEGATIVE   Ketones, ur NEGATIVE NEGATIVE mg/dL   Protein, ur NEGATIVE NEGATIVE mg/dL   Nitrite NEGATIVE NEGATIVE   Leukocytes, UA NEGATIVE NEGATIVE  Wet prep, genital   Collection Time: 03/31/16  1:50 PM  Result Value Ref Range   Yeast Wet Prep HPF POC NONE SEEN NONE SEEN   Trich, Wet Prep NONE SEEN NONE SEEN   Clue Cells Wet Prep HPF POC NONE SEEN NONE SEEN   WBC, Wet Prep HPF POC FEW (A) NONE SEEN   Sperm NONE SEEN     Imaging Studies:  [place] [facility] Ob Detail +14 Wk  03/13/2016  OBSTETRICAL ULTRASOUND: This exam was performed within a [facility] [facility]. The OB US report was generated in the AS system, and faxed to the ordering physician.  This report is available in the [facility] PACS. See the [facility] Obstetric US report via the Image Link.   Assessment: Erin [name] is  25 y.o. G2P1000 at 354w3d presents with leakage of fluid. History not strongly suggestive of pprom. Exam not suggestive of pprom, and fern negative. Wet prep also unremarkable, as was urinalysis. G/c pending. Likely leukorrhea. FHTs wnl  Plan: - f/u g, c - pprom return precautions  Silvano Bilisoah B Alisen Marsiglia 7/15/20172:39 PM

## 2016-03-31 NOTE — MAU Note (Signed)
Increased d/c thinks water is broken

## 2016-04-02 LAB — GC/CHLAMYDIA PROBE AMP (~~LOC~~) NOT AT ARMC
Chlamydia: NEGATIVE
NEISSERIA GONORRHEA: NEGATIVE

## 2016-04-24 ENCOUNTER — Ambulatory Visit (INDEPENDENT_AMBULATORY_CARE_PROVIDER_SITE_OTHER): Payer: Medicaid Other | Admitting: Medical

## 2016-04-24 VITALS — BP 125/89 | HR 72 | Wt 193.0 lb

## 2016-04-24 DIAGNOSIS — O0992 Supervision of high risk pregnancy, unspecified, second trimester: Secondary | ICD-10-CM | POA: Diagnosis not present

## 2016-04-24 LAB — POCT URINALYSIS DIP (DEVICE)
BILIRUBIN URINE: NEGATIVE
Glucose, UA: NEGATIVE mg/dL
Hgb urine dipstick: NEGATIVE
Ketones, ur: NEGATIVE mg/dL
Leukocytes, UA: NEGATIVE
NITRITE: NEGATIVE
PH: 6.5 (ref 5.0–8.0)
PROTEIN: NEGATIVE mg/dL
Specific Gravity, Urine: 1.02 (ref 1.005–1.030)
Urobilinogen, UA: 0.2 mg/dL (ref 0.0–1.0)

## 2016-04-24 NOTE — Patient Instructions (Addendum)
Second Trimester of Pregnancy The second trimester is from week 13 through week 28, month 4 through 6. This is often the time in pregnancy that you feel your best. Often times, morning sickness has lessened or quit. You may have more energy, and you may get hungry more often. Your unborn baby (fetus) is growing rapidly. At the end of the sixth month, he or she is about 9 inches long and weighs about 1 pounds. You will likely feel the baby move (quickening) between 18 and 20 weeks of pregnancy. HOME CARE   Avoid all smoking, herbs, and alcohol. Avoid drugs not approved by your doctor.  Do not use any tobacco products, including cigarettes, chewing tobacco, and electronic cigarettes. If you need help quitting, ask your doctor. You may get counseling or other support to help you quit.  Only take medicine as told by your doctor. Some medicines are safe and some are not during pregnancy.  Exercise only as told by your doctor. Stop exercising if you start having cramps.  Eat regular, healthy meals.  Wear a good support bra if your breasts are tender.  Do not use hot tubs, steam rooms, or saunas.  Wear your seat belt when driving.  Avoid raw meat, uncooked cheese, and liter boxes and soil used by cats.  Take your prenatal vitamins.  Take 1500-2000 milligrams of calcium daily starting at the 20th week of pregnancy until you deliver your baby.  Try taking medicine that helps you poop (stool softener) as needed, and if your doctor approves. Eat more fiber by eating fresh fruit, vegetables, and whole grains. Drink enough fluids to keep your pee (urine) clear or pale yellow.  Take warm water baths (sitz baths) to soothe pain or discomfort caused by hemorrhoids. Use hemorrhoid cream if your doctor approves.  If you have puffy, bulging veins (varicose veins), wear support hose. Raise (elevate) your feet for 15 minutes, 3-4 times a day. Limit salt in your diet.  Avoid heavy lifting, wear low heals,  and sit up straight.  Rest with your legs raised if you have leg cramps or low back pain.  Visit your dentist if you have not gone during your pregnancy. Use a soft toothbrush to brush your teeth. Be gentle when you floss.  You can have sex (intercourse) unless your doctor tells you not to.  Go to your doctor visits. GET HELP IF:   You feel dizzy.  You have mild cramps or pressure in your lower belly (abdomen).  You have a nagging pain in your belly area.  You continue to feel sick to your stomach (nauseous), throw up (vomit), or have watery poop (diarrhea).  You have bad smelling fluid coming from your vagina.  You have pain with peeing (urination). GET HELP RIGHT AWAY IF:   You have a fever.  You are leaking fluid from your vagina.  You have spotting or bleeding from your vagina.  You have severe belly cramping or pain.  You lose or gain weight rapidly.  You have trouble catching your breath and have chest pain.  You notice sudden or extreme puffiness (swelling) of your face, hands, ankles, feet, or legs.  You have not felt the baby move in over an hour.  You have severe headaches that do not go away with medicine.  You have vision changes.   This information is not intended to reBraxton Hicks Contractions Contractions of the uterus can occur throughout pregnancy. Contractions are not always a sign that you are  in labor.  WHAT ARE BRAXTON HICKS CONTRACTIONS?  Contractions that occur before labor are called Braxton Hicks contractions, or false labor. Toward the end of pregnancy (32-34 weeks), these contractions can develop more often and may become more forceful. This is not true labor because these contractions do not result in opening (dilatation) and thinning of the cervix. They are sometimes difficult to tell apart from true labor because these contractions can be forceful and people have different pain tolerances. You should not feel embarrassed if you go to the  hospital with false labor. Sometimes, the only way to tell if you are in true labor is for your health care provider to look for changes in the cervix. If there are no prenatal problems or other health problems associated with the pregnancy, it is completely safe to be sent home with false labor and await the onset of true labor. HOW CAN YOU TELL THE DIFFERENCE BETWEEN TRUE AND FALSE LABOR? False Labor  The contractions of false labor are usually shorter and not as hard as those of true labor.   The contractions are usually irregular.   The contractions are often felt in the front of the lower abdomen and in the groin.   The contractions may go away when you walk around or change positions while lying down.   The contractions get weaker and are shorter lasting as time goes on.   The contractions do not usually become progressively stronger, regular, and closer together as with true labor.  True Labor  Contractions in true labor last 30-70 seconds, become very regular, usually become more intense, and increase in frequency.   The contractions do not go away with walking.   The discomfort is usually felt in the top of the uterus and spreads to the lower abdomen and low back.   True labor can be determined by your health care provider with an exam. This will show that the cervix is dilating and getting thinner.  WHAT TO REMEMBER  Keep up with your usual exercises and follow other instructions given by your health care provider.   Take medicines as directed by your health care provider.   Keep your regular prenatal appointments.   Eat and drink lightly if you think you are going into labor.   If Braxton Hicks contractions are making you uncomfortable:   Change your position from lying down or resting to walking, or from walking to resting.   Sit and rest in a tub of warm water.   Drink 2-3 glasses of water. Dehydration may cause these contractions.   Do slow  and deep breathing several times an hour.  WHEN SHOULD I SEEK IMMEDIATE MEDICAL CARE? Seek immediate medical care if:  Your contractions become stronger, more regular, and closer together.   You have fluid leaking or gushing from your vagina.   You have a fever.   You pass blood-tinged mucus.   You have vaginal bleeding.   You have continuous abdominal pain.   You have low back pain that you never had before.   You feel your baby's head pushing down and causing pelvic pressure.   Your baby is not moving as much as it used to.    This information is not intended to replace advice given to you by your health care provider. Make sure you discuss any questions you have with your health care provider.   Document Released: 09/03/2005 Document Revised: 09/08/2013 Document Reviewed: 06/15/2013 Elsevier Interactive Patient Education [employer]  place advice given to you by your health care provider. Make sure you discuss any questions you have with your health care provider.   Document Released: 11/28/2009 Document Revised: 09/24/2014 Document Reviewed: 11/04/2012 Elsevier Interactive Patient Education [employer]

## 2016-04-24 NOTE — Progress Notes (Signed)
Patient reports contractions that are continuing to increase over time. Reports very painful ones last night Patient reports generally not feeling well over the past week.

## 2016-04-24 NOTE — Progress Notes (Signed)
  Subjective:  Erin BertholdChristina [name] is a 25 y.o. G2P1000 at 204w6d being seen today for ongoing prenatal care.  She is currently monitored for the following issues for this high-risk pregnancy and has Supervision of high risk pregnancy, antepartum; Previous cesarean delivery, antepartum; Neonatal death in prior pregnancy, currently pregnant; Rh negative state in antepartum period; and ASCUS with positive high risk HPV on her problem list.  Patient reports occasional contractions. The patient states few contractions most days x 2 weeks, however yesterday noted more contractions and last night had 3 painful contractions in 1 hour, then resolved.  Contractions: Irritability. Vag. Bleeding: None.  Movement: Present. Denies leaking of fluid.   The following portions of the patient's history were reviewed and updated as appropriate: allergies, current medications, past family history, past medical history, past social history, past surgical history and problem list. Problem list updated.  Objective:   Vitals:   04/24/16 1130  BP: 125/89  Pulse: 72  Weight: 193 lb (87.5 kg)    Fetal Status: Fetal Heart Rate (bpm): 145 Fundal Height: 26 cm Movement: Present     General:  Alert, oriented and cooperative. Patient is in no acute distress.  Skin: Skin is warm and dry. No rash noted.   Cardiovascular: Normal heart rate noted  Respiratory: Normal respiratory effort, no problems with respiration noted  Abdomen: Soft, gravid, appropriate for gestational age. Pain/Pressure: Present     Pelvic:  Cervical exam performed Dilation: Closed Effacement (%): 0 Station: Ballotable  Extremities: Normal range of motion.  Edema: None  Mental Status: Normal mood and affect. Normal behavior. Normal judgment and thought content.   Urinalysis: Urine Protein: Negative Urine Glucose: Negative  Assessment and Plan:  Pregnancy: G2P1000 at 884w6d  1. Supervision of high risk pregnancy, antepartum, second trimester - FFN  collected prior to SVE, but discarded as cervix is closed, thick and posterior - Discussed preterm labor s/s and reasons to present to MAU  Preterm labor symptoms and general obstetric precautions including but not limited to vaginal bleeding, contractions, leaking of fluid and fetal movement were reviewed in detail with the patient. Please refer to After Visit Summary for other counseling recommendations.  Return in about 2 weeks (around 05/08/2016) for HR - ROB/ 1 hr GTT and Rhogam.   Marny LowensteinJulie N Markasia Carrol, PA-C

## 2016-04-30 ENCOUNTER — Ambulatory Visit (INDEPENDENT_AMBULATORY_CARE_PROVIDER_SITE_OTHER): Payer: Medicaid Other | Admitting: *Deleted

## 2016-04-30 DIAGNOSIS — Z3492 Encounter for supervision of normal pregnancy, unspecified, second trimester: Secondary | ICD-10-CM

## 2016-04-30 DIAGNOSIS — Z3482 Encounter for supervision of other normal pregnancy, second trimester: Secondary | ICD-10-CM

## 2016-04-30 LAB — POCT URINALYSIS DIP (DEVICE)
Bilirubin Urine: NEGATIVE
Glucose, UA: NEGATIVE mg/dL
Hgb urine dipstick: NEGATIVE
KETONES UR: NEGATIVE mg/dL
Leukocytes, UA: NEGATIVE
Nitrite: NEGATIVE
PH: 6.5 (ref 5.0–8.0)
PROTEIN: NEGATIVE mg/dL
SPECIFIC GRAVITY, URINE: 1.01 (ref 1.005–1.030)
Urobilinogen, UA: 0.2 mg/dL (ref 0.0–1.0)

## 2016-04-30 NOTE — Progress Notes (Signed)
Patient reports that she has been experiencing low back pain that radiates under her stomach for about 3 days. Patients urine was clear and showed no signs of infection. I advised patient to monitor her symptoms and try taking tylenol for pain. She also has ordered a pregnancy support belt that she will try taking to help with her discomfort.

## 2016-05-05 ENCOUNTER — Inpatient Hospital Stay (HOSPITAL_COMMUNITY)
Admission: AD | Admit: 2016-05-05 | Discharge: 2016-05-05 | Disposition: A | Discharge status: Home / Self Care | Payer: Medicaid Other | Source: Ambulatory Visit | Attending: Obstetrics and Gynecology | Admitting: Obstetrics and Gynecology

## 2016-05-05 DIAGNOSIS — R110 Nausea: Secondary | ICD-10-CM | POA: Diagnosis not present

## 2016-05-05 DIAGNOSIS — R197 Diarrhea, unspecified: Secondary | ICD-10-CM | POA: Insufficient documentation

## 2016-05-05 DIAGNOSIS — Z881 Allergy status to other antibiotic agents status: Secondary | ICD-10-CM | POA: Diagnosis not present

## 2016-05-05 DIAGNOSIS — O219 Vomiting of pregnancy, unspecified: Secondary | ICD-10-CM | POA: Diagnosis present

## 2016-05-05 DIAGNOSIS — Z3A27 27 weeks gestation of pregnancy: Secondary | ICD-10-CM | POA: Diagnosis not present

## 2016-05-05 DIAGNOSIS — O26892 Other specified pregnancy related conditions, second trimester: Secondary | ICD-10-CM | POA: Insufficient documentation

## 2016-05-05 LAB — URINALYSIS, ROUTINE W REFLEX MICROSCOPIC
BILIRUBIN URINE: NEGATIVE
Glucose, UA: NEGATIVE mg/dL
HGB URINE DIPSTICK: NEGATIVE
Ketones, ur: NEGATIVE mg/dL
Leukocytes, UA: NEGATIVE
Nitrite: NEGATIVE
PH: 7 (ref 5.0–8.0)
Protein, ur: NEGATIVE mg/dL
SPECIFIC GRAVITY, URINE: 1.01 (ref 1.005–1.030)

## 2016-05-05 LAB — GLUCOSE, CAPILLARY: Glucose-Capillary: 96 mg/dL (ref 65–99)

## 2016-05-05 MED ORDER — ONDANSETRON HCL 4 MG/2ML IJ SOLN
4.0000 mg | Freq: Once | INTRAMUSCULAR | Status: AC
  Administered 2016-05-05: 4 mg via INTRAVENOUS
  Filled 2016-05-05: qty 2

## 2016-05-05 MED ORDER — LOPERAMIDE HCL 2 MG PO CAPS
2.0000 mg | ORAL_CAPSULE | ORAL | Status: DC | PRN
  Administered 2016-05-05: 2 mg via ORAL
  Filled 2016-05-05: qty 1

## 2016-05-05 MED ORDER — PROMETHAZINE HCL 12.5 MG RE SUPP: 12.5000 mg | Freq: Four times a day (QID) | RECTAL | 0 refills | Status: DC | PRN

## 2016-05-05 MED ORDER — LOPERAMIDE HCL 2 MG PO CAPS: 2.0000 mg | ORAL_CAPSULE | ORAL | 0 refills | Status: DC | PRN

## 2016-05-05 MED ORDER — PROMETHAZINE HCL 25 MG/ML IJ SOLN
25.0000 mg | Freq: Once | INTRAMUSCULAR | Status: AC
  Administered 2016-05-05: 25 mg via INTRAVENOUS
  Filled 2016-05-05: qty 1

## 2016-05-05 MED ORDER — LACTATED RINGERS IV SOLN
INTRAVENOUS | Status: DC
  Administered 2016-05-05: 08:00:00 via INTRAVENOUS

## 2016-05-05 MED ORDER — LACTATED RINGERS IV BOLUS (SEPSIS)
1000.0000 mL | Freq: Once | INTRAVENOUS | Status: AC
  Administered 2016-05-05: 1000 mL via INTRAVENOUS

## 2016-05-05 NOTE — MAU Note (Signed)
Pt c/o nonstop vomiting that started around 0100. This is a new problem. Denies being around anyone sick or fever. States she has some braxton hicks contractions. Denies LOF or vag bleeding. +FM

## 2016-05-05 NOTE — MAU Note (Signed)
Patient up to BR. Still having diarrhea. N/V has subsided. Tolerating sips of gingerale.

## 2016-05-05 NOTE — MAU Provider Note (Signed)
None     Chief Complaint:  Emesis During Pregnancy   Erin [name] is  25 y.o. G2P1000 at 2382w3d presents complaining of Emesis During Pregnancy  Patient woke up at 1:00am feeling hot and nauseated, feeling like she was going to pass out. She had blurry vision and had repeated episodes of emesis. She then passed out in the bathroom and since she has been having hot spells and feeling like she is going to pass out. When she came here to the MAU she had two episodes of diarrhea. Patient had chicken from [employer] yesterday afternoon. Patient has been having Deberah PeltonBraxton Hicks that worsened with the vomiting/nausea, those have mostly resolved.  She has been feeling nauseated for the past two weeks but no vomiting until last night. She has been having difficulty keeping down fluids, has had a couple sips of Sprite since presenting to MAU. Patient denies fevers, chills, pain with urination, and blood in stool. 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 Patient denies any medical conditions or any complications in pregnancy.  She states none contractions are associated with none vaginal bleeding, intact membranes, along with active fetal movement.   Obstetrical/Gynecological History: OB History    Gravida Para Term Preterm AB Living   2 1 1  0   0   SAB TAB Ectopic Multiple Live Births                 Past Medical History: No past medical history on file.  Past Surgical History: Past Surgical History:  Procedure Laterality Date  . CESAREAN SECTION    . egg donor x2       Family History: No family history on file.  Social History: Social History  Substance Use Topics  . Smoking status: Never Smoker  . Smokeless tobacco: Never Used  . Alcohol use No    Allergies:  Allergies  Allergen Reactions  . Sulfa Antibiotics     Meds:  Prescriptions Prior to Admission  Medication Sig Dispense Refill Last Dose  . prenatal vitamin w/FE, FA (PRENATAL 1 +  1) 27-1 MG TABS tablet Take 1 tablet by mouth daily at 12 noon.   05/04/2016 at Unknown time    Review of Systems   Constitutional: Negative for fever and chills Eyes: Negative for visual disturbances Respiratory: Negative for shortness of breath, dyspnea Cardiovascular: Negative for chest pain or palpitations  Gastrointestinal: Negative for vomiting, diarrhea and constipation Genitourinary: Negative for dysuria and urgency Musculoskeletal: Negative for back pain, joint pain, myalgias.  Normal ROM  Neurological: Negative for dizziness and headaches    Physical Exam  Blood pressure 124/68, pulse 71, temperature 97.3 F (36.3 C), temperature source Oral, resp. rate 20, height 5\' 8"  (1.727 m), weight 89.8 kg (198 lb), last menstrual period 10/06/2015, SpO2 100 %. GENERAL: Well-developed, well-nourished female in no acute distress.  LUNGS: Clear to auscultation bilaterally.  HEART: Regular rate and rhythm. ABDOMEN: Soft, nontender, nondistended, gravid.  EXTREMITIES: Nontender, no edema, 2+ distal pulses. FHT:  Baseline rate 140 bpm   Variability moderate  Accelerations present   Decelerations none Contractions: none    Labs: Results for orders placed or performed during the hospital encounter of 05/05/16 (from the past 24 hour(s))  Urinalysis, Routine w reflex microscopic (not [facility])   Collection Time: 05/05/16  5:46 AM  Result Value Ref Range   Color, Urine YELLOW YELLOW   APPearance CLEAR CLEAR   Specific Gravity, Urine 1.010 1.005 -  1.030   pH 7.0 5.0 - 8.0   Glucose, UA NEGATIVE NEGATIVE mg/dL   Hgb urine dipstick NEGATIVE NEGATIVE   Bilirubin Urine NEGATIVE NEGATIVE   Ketones, ur NEGATIVE NEGATIVE mg/dL   Protein, ur NEGATIVE NEGATIVE mg/dL   Nitrite NEGATIVE NEGATIVE   Leukocytes, UA NEGATIVE NEGATIVE  Glucose, capillary   Collection Time: 05/05/16  6:15 AM  Result Value Ref Range   Glucose-Capillary 96 65 - 99 mg/dL   Imaging Studies:  No results  found.  Assessment: Erin [name] is  25 y.o. G2P1000 at 6560w3d presents with nausea/vomitting and diarrhea likely to Gastroenteritis  .  Plan: - UA normal and POCT glucose - Zofran IV, Lactated ringers bolus, Imodium  - Once patient tolerating fluids will discharge  - Vaginal Phenergan and  Imodium for home  - Counseled patient to continue fluids once ready for discharge   Erin [name] 8/19/20176:36 AM     OB FELLOW MAU DISCHARGE ATTESTATION  I have seen and examined this patient; I agree with above documentation in the resident's note.    Erin MowElizabeth Harrell Niehoff, DO OB Fellow 05/05/2016 7:11 AM

## 2016-05-05 NOTE — MAU Note (Signed)
Patient states she is starting to feel nauseated again. Order for Phenergan obtained from M. Mayford KnifeWilliams, CNM.

## 2016-05-05 NOTE — Discharge Instructions (Signed)
You were seen for nausea, vomiting and diarrhea from gastroenteritis.  Please take nausea medication (phenergan) as needed and also imodium for diarrhea as needed. Please try to drink lots of fluids to keep hydrated.   Viral Gastroenteritis Viral gastroenteritis is also known as stomach flu. This condition affects the stomach and intestinal tract. It can cause sudden diarrhea and vomiting. The illness typically lasts 3 to 8 days. Most people develop an immune response that eventually gets rid of the virus. While this natural response develops, the virus can make you quite ill. CAUSES  Many different viruses can cause gastroenteritis, such as rotavirus or noroviruses. You can catch one of these viruses by consuming contaminated food or water. You may also catch a virus by sharing utensils or other personal items with an infected person or by touching a contaminated surface. SYMPTOMS  The most common symptoms are diarrhea and vomiting. These problems can cause a severe loss of body fluids (dehydration) and a body salt (electrolyte) imbalance. Other symptoms may include:  Fever.  Headache.  Fatigue.  Abdominal pain. DIAGNOSIS  Your caregiver can usually diagnose viral gastroenteritis based on your symptoms and a physical exam. A stool sample may also be taken to test for the presence of viruses or other infections. TREATMENT  This illness typically goes away on its own. Treatments are aimed at rehydration. The most serious cases of viral gastroenteritis involve vomiting so severely that you are not able to keep fluids down. In these cases, fluids must be given through an intravenous line (IV). HOME CARE INSTRUCTIONS   Drink enough fluids to keep your urine clear or pale yellow. Drink small amounts of fluids frequently and increase the amounts as tolerated.  Ask your caregiver for specific rehydration instructions.  Avoid:  Foods high in sugar.  Alcohol.  Carbonated  drinks.  Tobacco.  Juice.  Caffeine drinks.  Extremely hot or cold fluids.  Fatty, greasy foods.  Too much intake of anything at one time.  Dairy products until 24 to 48 hours after diarrhea stops.  You may consume probiotics. Probiotics are active cultures of beneficial bacteria. They may lessen the amount and number of diarrheal stools in adults. Probiotics can be found in yogurt with active cultures and in supplements.  Wash your hands well to avoid spreading the virus.  Only take over-the-counter or prescription medicines for pain, discomfort, or fever as directed by your caregiver. Do not give aspirin to children. Antidiarrheal medicines are not recommended.  Ask your caregiver if you should continue to take your regular prescribed and over-the-counter medicines.  Keep all follow-up appointments as directed by your caregiver. SEEK IMMEDIATE MEDICAL CARE IF:   You are unable to keep fluids down.  You do not urinate at least once every 6 to 8 hours.  You develop shortness of breath.  You notice blood in your stool or vomit. This may look like coffee grounds.  You have abdominal pain that increases or is concentrated in one small area (localized).  You have persistent vomiting or diarrhea.  You have a fever.  The patient is a child younger than 3 months, and he or she has a fever.  The patient is a child older than 3 months, and he or she has a fever and persistent symptoms.  The patient is a child older than 3 months, and he or she has a fever and symptoms suddenly get worse.  The patient is a baby, and he or she has no tears when crying.  MAKE SURE YOU:   Understand these instructions.  Will watch your condition.  Will get help right away if you are not doing well or get worse.   This information is not intended to replace advice given to you by your health care provider. Make sure you discuss any questions you have with your health care provider.    Document Released: 09/03/2005 Document Revised: 11/26/2011 Document Reviewed: 06/20/2011 Elsevier Interactive Patient Education [employer]2016 Elsevier Inc.

## 2016-05-05 NOTE — MAU Note (Signed)
Patient feeling better, Just "tired and sleepy." Ready for D/C. Waiting for order from provider.

## 2016-05-05 NOTE — MAU Note (Signed)
Nausea, vomiting and passed out this morning. +FM, denies LOF and VB.

## 2016-05-08 ENCOUNTER — Ambulatory Visit (INDEPENDENT_AMBULATORY_CARE_PROVIDER_SITE_OTHER): Payer: Medicaid Other | Admitting: Advanced Practice Midwife

## 2016-05-08 VITALS — BP 119/70 | HR 75 | Wt 195.2 lb

## 2016-05-08 DIAGNOSIS — Z23 Encounter for immunization: Secondary | ICD-10-CM

## 2016-05-08 DIAGNOSIS — O0992 Supervision of high risk pregnancy, unspecified, second trimester: Secondary | ICD-10-CM

## 2016-05-08 DIAGNOSIS — O360120 Maternal care for anti-D [Rh] antibodies, second trimester, not applicable or unspecified: Secondary | ICD-10-CM

## 2016-05-08 DIAGNOSIS — O34219 Maternal care for unspecified type scar from previous cesarean delivery: Secondary | ICD-10-CM

## 2016-05-08 DIAGNOSIS — O360121 Maternal care for anti-D [Rh] antibodies, second trimester, fetus 1: Secondary | ICD-10-CM

## 2016-05-08 LAB — CBC
HEMATOCRIT: 37.7 % (ref 35.0–45.0)
Hemoglobin: 12.2 g/dL (ref 11.7–15.5)
MCH: 28.8 pg (ref 27.0–33.0)
MCHC: 32.4 g/dL (ref 32.0–36.0)
MCV: 88.9 fL (ref 80.0–100.0)
MPV: 10.2 fL (ref 7.5–12.5)
Platelets: 258 10*3/uL (ref 140–400)
RBC: 4.24 MIL/uL (ref 3.80–5.10)
RDW: 13.7 % (ref 11.0–15.0)
WBC: 7.5 10*3/uL (ref 3.8–10.8)

## 2016-05-08 LAB — POCT URINALYSIS DIP (DEVICE)
Bilirubin Urine: NEGATIVE
Glucose, UA: 250 mg/dL — AB
HGB URINE DIPSTICK: NEGATIVE
KETONES UR: NEGATIVE mg/dL
Leukocytes, UA: NEGATIVE
Nitrite: NEGATIVE
PH: 6 (ref 5.0–8.0)
PROTEIN: NEGATIVE mg/dL
Specific Gravity, Urine: <=1.005 (ref 1.005–1.030)
Urobilinogen, UA: 0.2 mg/dL (ref 0.0–1.0)

## 2016-05-08 MED ORDER — RHO D IMMUNE GLOBULIN 1500 UNIT/2ML IJ SOSY
300.0000 ug | PREFILLED_SYRINGE | Freq: Once | INTRAMUSCULAR | Status: AC
  Administered 2016-05-08: 300 ug via INTRAMUSCULAR

## 2016-05-08 NOTE — Progress Notes (Signed)
28 week labs, tdap and rhophylac today.

## 2016-05-08 NOTE — Progress Notes (Signed)
Subjective:  Erin BertholdChristina [name] is a 25 y.o. G2P1000 at 3624w6d being seen today for ongoing prenatal care.  She is currently monitored for the following issues for this high-risk pregnancy and has Supervision of high risk pregnancy, antepartum; Previous cesarean delivery, antepartum; Neonatal death in prior pregnancy, currently pregnant; Rh negative state in antepartum period; and ASCUS with positive high risk HPV on her problem list.  Patient reports occasional contractions.  Contractions: Not present. Vag. Bleeding: None.  Movement: Present. Denies leaking of fluid.   The following portions of the patient's history were reviewed and updated as appropriate: allergies, current medications, past family history, past medical history, past social history, past surgical history and problem list. Problem list updated.  Objective:   Vitals:   05/08/16 1341  BP: 119/70  Pulse: 75  Weight: 195 lb 3.2 oz (88.5 kg)    Fetal Status: Fetal Heart Rate (bpm): 140 Fundal Height: 29 cm Movement: Present     General:  Alert, oriented and cooperative. Patient is in no acute distress.  Skin: Skin is warm and dry. No rash noted.   Cardiovascular: Normal heart rate noted  Respiratory: Normal respiratory effort, no problems with respiration noted  Abdomen: Soft, gravid, appropriate for gestational age. Pain/Pressure: Absent     Pelvic:  Cervical exam deferred        Extremities: Normal range of motion.  Edema: None  Mental Status: Normal mood and affect. Normal behavior. Normal judgment and thought content.   Urinalysis:      Assessment and Plan:  Pregnancy: G2P1000 at 3524w6d  1. Rh negative state in antepartum period, second trimester, fetus 1  - rho (d) immune globulin (RHIG/RHOPHYLAC) injection 300 mcg; Inject 2 mLs (300 mcg total) into the muscle once.  2. Need for Tdap vaccination  - Tdap vaccine greater than or equal to 7yo IM  3. Supervision of high risk pregnancy in second trimester  -  Glucose Tolerance, 1 HR (50g) w/o Fasting - CBC - HIV antibody (with reflex) - RPR  4. Supervision of high risk pregnancy, antepartum, second trimester  - Glucose Tolerance, 1 HR (50g) w/o Fasting - CBC - HIV antibody (with reflex) - RPR - rho (d) immune globulin (RHIG/RHOPHYLAC) injection 300 mcg; Inject 2 mLs (300 mcg total) into the muscle once. - Tdap vaccine greater than or equal to 7yo IM  5. Previous cesarean delivery, antepartum  - Glucose Tolerance, 1 HR (50g) w/o Fasting - CBC - HIV antibody (with reflex) - RPR - rho (d) immune globulin (RHIG/RHOPHYLAC) injection 300 mcg; Inject 2 mLs (300 mcg total) into the muscle once. - Tdap vaccine greater than or equal to 7yo IM  Preterm labor symptoms and general obstetric precautions including but not limited to vaginal bleeding, contractions, leaking of fluid and fetal movement were reviewed in detail with the patient. Please refer to After Visit Summary for other counseling recommendations.  Return in about 2 weeks (around 05/22/2016).   Dorathy KinsmanVirginia Eustacio Ellen, CNM

## 2016-05-08 NOTE — Patient Instructions (Signed)

## 2016-05-09 ENCOUNTER — Encounter: Payer: Self-pay | Admitting: *Deleted

## 2016-05-09 LAB — GLUCOSE TOLERANCE, 1 HOUR (50G) W/O FASTING: GLUCOSE, 1 HR, GESTATIONAL: 157 mg/dL — AB (ref <140)

## 2016-05-09 LAB — RPR

## 2016-05-09 LAB — HIV ANTIBODY (ROUTINE TESTING W REFLEX): HIV 1&2 Ab, 4th Generation: NONREACTIVE

## 2016-05-14 ENCOUNTER — Telehealth: Payer: Self-pay

## 2016-05-14 ENCOUNTER — Inpatient Hospital Stay (HOSPITAL_COMMUNITY)
Admission: AD | Admit: 2016-05-14 | Discharge: 2016-05-14 | Disposition: A | Discharge status: Home / Self Care | Payer: Medicaid Other | Source: Ambulatory Visit | Attending: Family Medicine | Admitting: Family Medicine

## 2016-05-14 ENCOUNTER — Encounter (HOSPITAL_COMMUNITY): Payer: Self-pay

## 2016-05-14 DIAGNOSIS — O26893 Other specified pregnancy related conditions, third trimester: Secondary | ICD-10-CM

## 2016-05-14 DIAGNOSIS — Z3A28 28 weeks gestation of pregnancy: Secondary | ICD-10-CM | POA: Diagnosis not present

## 2016-05-14 DIAGNOSIS — N898 Other specified noninflammatory disorders of vagina: Secondary | ICD-10-CM | POA: Diagnosis present

## 2016-05-14 LAB — URINALYSIS, ROUTINE W REFLEX MICROSCOPIC
BILIRUBIN URINE: NEGATIVE
Glucose, UA: NEGATIVE mg/dL
HGB URINE DIPSTICK: NEGATIVE
Ketones, ur: NEGATIVE mg/dL
Leukocytes, UA: NEGATIVE
Nitrite: NEGATIVE
PROTEIN: NEGATIVE mg/dL
Specific Gravity, Urine: 1.01 (ref 1.005–1.030)
pH: 6.5 (ref 5.0–8.0)

## 2016-05-14 LAB — WET PREP, GENITAL
Clue Cells Wet Prep HPF POC: NONE SEEN
Sperm: NONE SEEN
Trich, Wet Prep: NONE SEEN
YEAST WET PREP: NONE SEEN

## 2016-05-14 LAB — POCT FERN TEST: POCT FERN TEST: NEGATIVE

## 2016-05-14 LAB — AMNISURE RUPTURE OF MEMBRANE (ROM) NOT AT ARMC: Amnisure ROM: NEGATIVE

## 2016-05-14 NOTE — MAU Provider Note (Signed)
History    CSN: [id number]  Arrival date and time: 05/14/16 1706  Chief Complaint  Patient presents with  . possible rupture of membranes   HPI Erin [name] is a 25yo G2P1000 at [redacted]w[redacted]d GA who presents today after feeling a small gush of clear fluid from her vagina.  This happened around 4:30pm this afternoon.    Pt denies contractions or bleeding.  Pt can feel baby moving. Fetal monitoring strip is reassuring.  OB History    Gravida Para Term Preterm AB Living   2 1 1  0   0   SAB TAB Ectopic Multiple Live Births                  PMH Active Ambulatory Problems    Diagnosis Date Noted  . Supervision of high risk pregnancy, antepartum Jan 18, 2016  . Previous cesarean delivery, antepartum 2016-01-18  . Neonatal death in prior pregnancy, currently pregnant 01-18-16  . Rh negative state in antepartum period 12/20/2015  . ASCUS with positive high risk HPV 01/23/2016   Resolved Ambulatory Problems    Diagnosis Date Noted  . No Resolved Ambulatory Problems   No Additional Past Medical History     Past Surgical History:  Procedure Laterality Date  . CESAREAN SECTION    . egg donor x2       History reviewed. No pertinent family history.  Social History  Substance Use Topics  . Smoking status: Never Smoker  . Smokeless tobacco: Never Used  . Alcohol use No    Allergies:  Allergies  Allergen Reactions  . Sulfa Antibiotics Rash    Prescriptions Prior to Admission  Medication Sig Dispense Refill Last Dose  . loperamide (IMODIUM) 2 MG capsule Take 1 capsule (2 mg total) by mouth as needed for diarrhea or loose stools (After each loose stool). 30 capsule 0   . prenatal vitamin w/FE, FA (PRENATAL 1 + 1) 27-1 MG TABS tablet Take 1 tablet by mouth daily at 12 noon.    Taking  . promethazine (PHENERGAN) 12.5 MG suppository Place 1 suppository (12.5 mg total) rectally every 6 (six) hours as needed for nausea or vomiting. 12 each 0     Review of Systems   Constitutional: Negative for chills and fever.  Gastrointestinal: Negative for abdominal pain and diarrhea.  Genitourinary: Negative for dysuria, hematuria and urgency.  Neurological: Negative for dizziness and headaches.   Physical Exam   Blood pressure 106/72, pulse 75, temperature 98.3 F (36.8 C), temperature source Oral, resp. rate 16, weight 89.2 kg (196 lb 12 oz), last menstrual period 10/06/2015.  Physical Exam  Constitutional: She is oriented to person, place, and time. She appears well-developed and well-nourished. No distress.  HENT:  Head: Normocephalic and atraumatic.  Eyes: EOM are normal. Pupils are equal, round, and reactive to light.  GI: Soft. Bowel sounds are normal. There is no tenderness. There is no guarding.  Genitourinary: There is no lesion on the right labia. There is no lesion on the left labia. Cervix exhibits discharge. No bleeding in the vagina. Vaginal discharge found.  Genitourinary Comments: Small amount of whitish discharge present.  No pooling of fluid.  Cervix closed  Neurological: She is alert and oriented to person, place, and time.  Skin: Skin is warm and dry. No rash noted. No erythema.  Psychiatric: She has a normal mood and affect. Her behavior is normal.    Results for orders placed or performed during the hospital encounter of 05/14/16 (from the past 24  hour(s))  Wet prep, genital     Status: Abnormal   Collection Time: 05/14/16  6:00 PM  Result Value Ref Range   Yeast Wet Prep HPF POC NONE SEEN NONE SEEN   Trich, Wet Prep NONE SEEN NONE SEEN   Clue Cells Wet Prep HPF POC NONE SEEN NONE SEEN   WBC, Wet Prep HPF POC FEW (A) NONE SEEN   Sperm NONE SEEN   Fern Test     Status: None   Collection Time: 05/14/16  6:17 PM  Result Value Ref Range   POCT Fern Test Negative = intact amniotic membranes     MAU Course  Procedures  MDM Fern test and AmniSure were ordered to rule out PROM. Wet prep was obtained to assess for BV,  yeast.  Assessment and Plan  1. Supervision of high risk pregnancy, third trimester  Discharge home.  Signs of preterm labor were reviewed in detail with the patient, including vaginal bleeding, early onset of regular contractions and leakage of amniotic fluid.  Pt knows to return if current symptoms should persist or worsen.  Hady Niemczyk 05/14/2016, 6:06 PM

## 2016-05-14 NOTE — MAU Note (Signed)
Around 4, stood up and had a small gush of fluid, has continued to leak small amt of clear fluid. No bleeding. No pain.

## 2016-05-14 NOTE — Telephone Encounter (Signed)
I have called and left this patient a message concerning lab results. Needs 3 hour GTT.

## 2016-05-14 NOTE — MAU Provider Note (Signed)
History   [id number]   Chief Complaint  Patient presents with  . possible rupture of membranes    HPI Erin [name] is a 25 y.o. female  G2P1000 here with report of watery vaginal discharge that began at approximately 1630 today. Reports gush of clear fluid that soaked her underwear. Has felt some discharge/leaking since then. Denies abdominal pain or vaginal bleeding. No intercourse in the last 24 hours. Positive fetal movement.    Patient's last menstrual period was 10/06/2015.  OB History  Gravida Para Term Preterm AB Living  2 1 1  0   0  SAB TAB Ectopic Multiple Live Births               # Outcome Date GA Lbr Len/2nd Weight Sex Delivery Anes PTL Lv  2 Current           1 Term 04/08/14    Judie PetitM CS-LTranv   FD     Complications: Abruptio Placenta     Birth Comments: fetal kidney disorder      History reviewed. No pertinent past medical history.  History reviewed. No pertinent family history.  Social History   Social History  . Marital status: Married    Spouse name: N/A  . Number of children: N/A  . Years of education: N/A   Social History Main Topics  . Smoking status: Never Smoker  . Smokeless tobacco: Never Used  . Alcohol use No  . Drug use: No  . Sexual activity: Yes   Other Topics Concern  . None   Social History Narrative  . None    Allergies  Allergen Reactions  . Sulfa Antibiotics Rash    No current facility-administered medications on file prior to encounter.    Current Outpatient Prescriptions on File Prior to Encounter  Medication Sig Dispense Refill  . loperamide (IMODIUM) 2 MG capsule Take 1 capsule (2 mg total) by mouth as needed for diarrhea or loose stools (After each loose stool). 30 capsule 0  . prenatal vitamin w/FE, FA (PRENATAL 1 + 1) 27-1 MG TABS tablet Take 1 tablet by mouth daily at 12 noon.     . promethazine (PHENERGAN) 12.5 MG suppository Place 1 suppository (12.5 mg total) rectally every 6 (six) hours as needed for nausea  or vomiting. 12 each 0     Review of Systems  Constitutional: Negative.   Gastrointestinal: Negative.   Genitourinary: Positive for vaginal discharge. Negative for vaginal bleeding.     Physical Exam   Vitals:   05/14/16 1718  BP: 106/72  Pulse: 75  Resp: 16  Temp: 98.3 F (36.8 C)  TempSrc: Oral  Weight: 196 lb 12 oz (89.2 kg)    Physical Exam  Nursing note and vitals reviewed. Constitutional: She is oriented to person, place, and time. She appears well-developed and well-nourished. No distress.  HENT:  Head: Normocephalic and atraumatic.  Eyes: Conjunctivae are normal. Right eye exhibits no discharge. Left eye exhibits no discharge. No scleral icterus.  Neck: Normal range of motion.  Respiratory: Effort normal. No respiratory distress.  Genitourinary: Vaginal discharge (small amount of white mucoid discharge) found.  Genitourinary Comments: Cervix visually closed No pooling  Neurological: She is alert and oriented to person, place, and time.  Skin: Skin is warm and dry. She is not diaphoretic.  Psychiatric: She has a normal mood and affect. Her behavior is normal. Judgment and thought content normal.   Fetal Tracing:  Baseline: 140 Variability: moderate Accelerations: 15x15 Decelerations: none  Toco:  none   MAU Course  Procedures Results for orders placed or performed during the hospital encounter of 05/14/16 (from the past 24 hour(s))  Amnisure rupture of membrane (rom)not at [facility]     Status: None   Collection Time: 05/14/16  6:00 PM  Result Value Ref Range   Amnisure ROM NEGATIVE   Wet prep, genital     Status: Abnormal   Collection Time: 05/14/16  6:00 PM  Result Value Ref Range   Yeast Wet Prep HPF POC NONE SEEN NONE SEEN   Trich, Wet Prep NONE SEEN NONE SEEN   Clue Cells Wet Prep HPF POC NONE SEEN NONE SEEN   WBC, Wet Prep HPF POC FEW (A) NONE SEEN   Sperm NONE SEEN   Fern Test     Status: None   Collection Time: 05/14/16  6:17 PM  Result Value  Ref Range   POCT Fern Test Negative = intact amniotic membranes   Urinalysis, Routine w reflex microscopic (not at [facility])     Status: None   Collection Time: 05/14/16  6:20 PM  Result Value Ref Range   Color, Urine YELLOW YELLOW   APPearance CLEAR CLEAR   Specific Gravity, Urine 1.010 1.005 - 1.030   pH 6.5 5.0 - 8.0   Glucose, UA NEGATIVE NEGATIVE mg/dL   Hgb urine dipstick NEGATIVE NEGATIVE   Bilirubin Urine NEGATIVE NEGATIVE   Ketones, ur NEGATIVE NEGATIVE mg/dL   Protein, ur NEGATIVE NEGATIVE mg/dL   Nitrite NEGATIVE NEGATIVE   Leukocytes, UA NEGATIVE NEGATIVE    MDM Reactive fetal tracing No pooling, cervix visually closed Fern negative Amnisure collected Amnisure negative Assessment and Plan  A: 1. Vaginal discharge during pregnancy in third trimester     P: Discharge home Preterm labor precautions Discussed reasons to return to MAU Keep f/u with OB  Judeth Horn, NP 05/14/2016 5:47 PM

## 2016-05-14 NOTE — Discharge Instructions (Signed)

## 2016-05-15 NOTE — Telephone Encounter (Signed)
Patient to come in on 05/18/2016

## 2016-05-17 ENCOUNTER — Other Ambulatory Visit: Payer: Medicaid Other

## 2016-05-18 ENCOUNTER — Other Ambulatory Visit: Payer: Medicaid Other

## 2016-05-18 DIAGNOSIS — R7309 Other abnormal glucose: Secondary | ICD-10-CM

## 2016-05-19 LAB — GLUCOSE TOLERANCE, 3 HOURS
GLUCOSE 3 HOUR GTT: 34 mg/dL — AB (ref <145)
GLUCOSE, FASTING-GESTATIONAL: 72 mg/dL (ref 65–104)
Glucose Tolerance, 1 hour: 137 mg/dL (ref <190)
Glucose Tolerance, 2 hour: 108 mg/dL (ref <165)

## 2016-05-25 ENCOUNTER — Ambulatory Visit (INDEPENDENT_AMBULATORY_CARE_PROVIDER_SITE_OTHER): Payer: Medicaid Other | Admitting: Obstetrics and Gynecology

## 2016-05-25 VITALS — BP 126/75 | HR 86 | Wt 200.4 lb

## 2016-05-25 DIAGNOSIS — O0993 Supervision of high risk pregnancy, unspecified, third trimester: Secondary | ICD-10-CM

## 2016-05-25 DIAGNOSIS — O34219 Maternal care for unspecified type scar from previous cesarean delivery: Secondary | ICD-10-CM

## 2016-05-25 DIAGNOSIS — O09293 Supervision of pregnancy with other poor reproductive or obstetric history, third trimester: Secondary | ICD-10-CM

## 2016-05-25 LAB — POCT URINALYSIS DIP (DEVICE)
BILIRUBIN URINE: NEGATIVE
GLUCOSE, UA: 250 mg/dL — AB
Hgb urine dipstick: NEGATIVE
Ketones, ur: NEGATIVE mg/dL
LEUKOCYTES UA: NEGATIVE
NITRITE: NEGATIVE
Protein, ur: NEGATIVE mg/dL
Specific Gravity, Urine: 1.01 (ref 1.005–1.030)
UROBILINOGEN UA: 0.2 mg/dL (ref 0.0–1.0)
pH: 6 (ref 5.0–8.0)

## 2016-05-25 NOTE — Progress Notes (Signed)
Subjective:  Erin BertholdChristina [name] is a 25 y.o. G2P1000 at 425w2d being seen today for ongoing prenatal care.  She is currently monitored for the following issues for this high-risk pregnancy and has Supervision of high risk pregnancy, antepartum; Previous cesarean delivery, antepartum; Neonatal death in prior pregnancy, currently pregnant; Rh negative state in antepartum period; and ASCUS with positive high risk HPV on her problem list.  Patient reports no complaints.  Contractions: Irritability. Vag. Bleeding: None.  Movement: Present. Denies leaking of fluid.   The following portions of the patient's history were reviewed and updated as appropriate: allergies, current medications, past family history, past medical history, past social history, past surgical history and problem list. Problem list updated.  Objective:   Vitals:   05/25/16 1029  BP: 126/75  Pulse: 86  Weight: 90.9 kg (200 lb 6.4 oz)    Fetal Status: Fetal Heart Rate (bpm): 138   Movement: Present     General:  Alert, oriented and cooperative. Patient is in no acute distress.  Skin: Skin is warm and dry. No rash noted.   Cardiovascular: Normal heart rate noted  Respiratory: Normal respiratory effort, no problems with respiration noted  Abdomen: Soft, gravid, appropriate for gestational age. Pain/Pressure: Present     Pelvic:  Cervical exam deferred        Extremities: Normal range of motion.  Edema: Trace  Mental Status: Normal mood and affect. Normal behavior. Normal judgment and thought content.   Urinalysis:      Assessment and Plan:  Pregnancy: G2P1000 at 2325w2d  1. Supervision of high risk pregnancy, antepartum, third trimester   2. Previous cesarean delivery, antepartum Desires VBAC, papers have been signed.   3. Neonatal death in prior pregnancy, currently pregnant, third trimester   Preterm labor symptoms and general obstetric precautions including but not limited to vaginal bleeding, contractions, leaking  of fluid and fetal movement were reviewed in detail with the patient. Please refer to After Visit Summary for other counseling recommendations.  Return in about 2 weeks (around 06/08/2016) for OB visit.   Hermina StaggersMichael L Krystie Leiter, MD

## 2016-05-25 NOTE — Progress Notes (Signed)
Flu vaccine declined, will get at work

## 2016-06-11 ENCOUNTER — Ambulatory Visit (INDEPENDENT_AMBULATORY_CARE_PROVIDER_SITE_OTHER): Payer: Medicaid Other | Admitting: Obstetrics and Gynecology

## 2016-06-11 ENCOUNTER — Encounter: Payer: Self-pay | Admitting: Obstetrics and Gynecology

## 2016-06-11 VITALS — BP 124/68 | HR 86 | Wt 207.0 lb

## 2016-06-11 DIAGNOSIS — O0993 Supervision of high risk pregnancy, unspecified, third trimester: Secondary | ICD-10-CM

## 2016-06-11 DIAGNOSIS — O34219 Maternal care for unspecified type scar from previous cesarean delivery: Secondary | ICD-10-CM

## 2016-06-11 DIAGNOSIS — O360130 Maternal care for anti-D [Rh] antibodies, third trimester, not applicable or unspecified: Secondary | ICD-10-CM

## 2016-06-11 NOTE — Progress Notes (Signed)
Prenatal Visit Note Date: 06/11/2016 [facility]  Subjective:  Erin BertholdChristina [name] is a 25 y.o. G2P0100 at 7275w5d being seen today for ongoing prenatal care.  She is currently monitored for the following issues for this high-risk pregnancy and has Supervision of high risk pregnancy, antepartum; Previous cesarean delivery, antepartum; Neonatal death in prior pregnancy, currently pregnant; Rh negative state in antepartum period; and ASCUS with positive high risk HPV on her problem list.  Patient reports no complaints.   Contractions: Irritability.  .  Movement: Present. Denies leaking of fluid.   The following portions of the patient's history were reviewed and updated as appropriate: allergies, current medications, past family history, past medical history, past social history, past surgical history and problem list. Problem list updated.  Objective:   Vitals:   06/11/16 0839  BP: 124/68  Pulse: 86  Weight: 207 lb (93.9 kg)    Fetal Status: Fetal Heart Rate (bpm): 154 Fundal Height: 32 cm Movement: Present  Presentation: Vertex  General:  Alert, oriented and cooperative. Patient is in no acute distress.  Skin: Skin is warm and dry. No rash noted.   Cardiovascular: Normal heart rate noted  Respiratory: Normal respiratory effort, no problems with respiration noted  Abdomen: Soft, gravid, appropriate for gestational age. Pain/Pressure: Present     Pelvic:  Cervical exam deferred        Extremities: Normal range of motion.     Mental Status: Normal mood and affect. Normal behavior. Normal judgment and thought content.   Urinalysis:      Assessment and Plan:  Pregnancy: G2P0100 at 3575w5d  1. Supervision of high risk pregnancy, antepartum, third trimester Routine care. Normal 3hr last visit  2. Rh negative state in antepartum period, third trimester, not applicable or unspecified fetus S/p rhogam already  3. [facility] Pt still desires TOLAC and consent already signed.  Preterm labor symptoms and general obstetric precautions including but not limited to vaginal bleeding, contractions, leaking of fluid and fetal movement were reviewed in detail with the patient. Please refer to After Visit Summary for other counseling recommendations.  Return in about 2 weeks (around 06/25/2016).   Erin Bingharlie Vernel Donlan, MD

## 2016-06-26 ENCOUNTER — Ambulatory Visit (INDEPENDENT_AMBULATORY_CARE_PROVIDER_SITE_OTHER): Payer: Medicaid Other | Admitting: Family

## 2016-06-26 VITALS — BP 132/75 | HR 69 | Wt 210.1 lb

## 2016-06-26 DIAGNOSIS — O0990 Supervision of high risk pregnancy, unspecified, unspecified trimester: Secondary | ICD-10-CM

## 2016-06-26 DIAGNOSIS — O34219 Maternal care for unspecified type scar from previous cesarean delivery: Secondary | ICD-10-CM

## 2016-06-26 DIAGNOSIS — O26899 Other specified pregnancy related conditions, unspecified trimester: Secondary | ICD-10-CM

## 2016-06-26 DIAGNOSIS — Z6791 Unspecified blood type, Rh negative: Secondary | ICD-10-CM

## 2016-06-26 DIAGNOSIS — O0993 Supervision of high risk pregnancy, unspecified, third trimester: Secondary | ICD-10-CM

## 2016-06-26 NOTE — Progress Notes (Signed)
   PRENATAL VISIT NOTE  Subjective:  Erin BertholdChristina [name] is a 25 y.o. G2P0100 at 9614w6d being seen today for ongoing prenatal care.  She is currently monitored for the following issues for this high-risk pregnancy and has Supervision of high risk pregnancy, antepartum; Previous cesarean delivery, antepartum; Neonatal death in prior pregnancy, currently pregnant; Rh negative state in antepartum period; and ASCUS with positive high risk HPV on her problem list.  Patient reports a pain x 1 after standing up after going to the bathroom.  Felt like pressure in lower pelvis.  Lasted approximately 2 minutes.  Contractions: Irritability. Vag. Bleeding: None.  Movement: Present. Denies leaking of fluid.   The following portions of the patient's history were reviewed and updated as appropriate: allergies, current medications, past family history, past medical history, past social history, past surgical history and problem list. Problem list updated.  Objective:   Vitals:   06/26/16 0911  BP: 132/75  Pulse: 69  Weight: 210 lb 1.6 oz (95.3 kg)    Fetal Status: Fetal Heart Rate (bpm): 130 Fundal Height: 35 cm Movement: Present     General:  Alert, oriented and cooperative. Patient is in no acute distress.  Skin: Skin is warm and dry. No rash noted.   Cardiovascular: Normal heart rate noted  Respiratory: Normal respiratory effort, no problems with respiration noted  Abdomen: Soft, gravid, appropriate for gestational age. Pain/Pressure: Present     Pelvic:  Cervical exam deferred        Extremities: Normal range of motion.  Edema: Trace  Mental Status: Normal mood and affect. Normal behavior. Normal judgment and thought content.   Urinalysis:      Assessment and Plan:  Pregnancy: G2P0100 at 5614w6d  1. Supervision of high risk pregnancy, antepartum - Discussed GBS and GC/CT at next visit - Reviewed warning signs of labor  2. Previous cesarean delivery, antepartum - Plans TOLAC (consent  signed)  3. Rh negative state in antepartum period - Received Rhophylac  Preterm labor symptoms and general obstetric precautions including but not limited to vaginal bleeding, contractions, leaking of fluid and fetal movement were reviewed in detail with the patient. Please refer to After Visit Summary for other counseling recommendations.  Return in about 2 weeks (around 07/10/2016).  Eino FarberWalidah Kennith GainN [name], CNM

## 2016-07-11 ENCOUNTER — Encounter: Payer: Medicaid Other | Admitting: Advanced Practice Midwife

## 2016-07-16 ENCOUNTER — Encounter (HOSPITAL_COMMUNITY): Payer: Self-pay

## 2016-07-16 ENCOUNTER — Inpatient Hospital Stay (HOSPITAL_COMMUNITY)
Admission: AD | Admit: 2016-07-16 | Discharge: 2016-07-16 | Disposition: A | Discharge status: Home / Self Care | Payer: 59 | Source: Ambulatory Visit | Attending: Obstetrics & Gynecology | Admitting: Obstetrics & Gynecology

## 2016-07-16 DIAGNOSIS — N393 Stress incontinence (female) (male): Secondary | ICD-10-CM

## 2016-07-16 DIAGNOSIS — O26893 Other specified pregnancy related conditions, third trimester: Secondary | ICD-10-CM | POA: Diagnosis not present

## 2016-07-16 DIAGNOSIS — Z3A37 37 weeks gestation of pregnancy: Secondary | ICD-10-CM | POA: Insufficient documentation

## 2016-07-16 DIAGNOSIS — O26899 Other specified pregnancy related conditions, unspecified trimester: Secondary | ICD-10-CM

## 2016-07-16 DIAGNOSIS — O34219 Maternal care for unspecified type scar from previous cesarean delivery: Secondary | ICD-10-CM

## 2016-07-16 DIAGNOSIS — O09299 Supervision of pregnancy with other poor reproductive or obstetric history, unspecified trimester: Secondary | ICD-10-CM

## 2016-07-16 DIAGNOSIS — Z6791 Unspecified blood type, Rh negative: Secondary | ICD-10-CM | POA: Diagnosis not present

## 2016-07-16 DIAGNOSIS — N898 Other specified noninflammatory disorders of vagina: Secondary | ICD-10-CM | POA: Diagnosis present

## 2016-07-16 DIAGNOSIS — O0990 Supervision of high risk pregnancy, unspecified, unspecified trimester: Secondary | ICD-10-CM

## 2016-07-16 LAB — POCT FERN TEST: POCT Fern Test: NEGATIVE

## 2016-07-16 NOTE — MAU Note (Signed)
Pt presents complaining of leaking of fluid that happened after she stood up from the toilet. States it was clear. Denies pain. Reports good fetal movement. Denies bleeding.

## 2016-07-16 NOTE — MAU Provider Note (Signed)
History   G2P1001@ 37.5 WKS IN WITH C/O AFTER USING BATHROOM AND STANDING UP SHE STILL CONTINUED TO LEAK FLUID. WAS UNSURE IF IT WAS URINE OR HER WATER. ONLY HAVING OCCASIONAL CONTRACTIONS.  CSN: [id number]  Arrival date & time 07/16/16  0419   None     Chief Complaint  Patient presents with  . Vaginal Discharge    HPI  History reviewed. No pertinent past medical history.  Past Surgical History:  Procedure Laterality Date  . CESAREAN SECTION    . egg donor x2       No family history on file.  Social History  Substance Use Topics  . Smoking status: Never Smoker  . Smokeless tobacco: Never Used  . Alcohol use No    OB History    Gravida Para Term Preterm AB Living   2 1 0 1   0   SAB TAB Ectopic Multiple Live Births                  Obstetric Comments   G1: 26wk stat pLTCS @ [facility] (VB, ?abruption, oligo bradycardia and multiple fetal anomalies). Demised neonate.       Review of Systems  Constitutional: Negative.   HENT: Negative.   Eyes: Negative.   Respiratory: Negative.   Cardiovascular: Negative.   Gastrointestinal: Negative.   Endocrine: Negative.   Genitourinary: Negative.   Musculoskeletal: Negative.   Skin: Negative.   Allergic/Immunologic: Negative.   Neurological: Negative.   Hematological: Negative.   Psychiatric/Behavioral: Negative.     Allergies  Sulfa antibiotics  Home Medications    BP 138/77 (BP Location: Right Arm)   Pulse 79   Temp 98.1 F (36.7 C) (Oral)   Resp 18   LMP 10/06/2015   Physical Exam  Constitutional: She is oriented to person, place, and time. She appears well-developed and well-nourished.  HENT:  Head: Normocephalic.  Eyes: Pupils are equal, round, and reactive to light.  Neck: Normal range of motion.  Cardiovascular: Normal rate, regular rhythm, normal heart sounds and intact distal pulses.   Pulmonary/Chest: Effort normal and breath sounds normal.  Abdominal: Soft. Bowel sounds are normal.   Genitourinary: Vagina normal and uterus normal.  Musculoskeletal: Normal range of motion.  Neurological: She is alert and oriented to person, place, and time. She has normal reflexes.  Skin: Skin is warm and dry.  Psychiatric: She has a normal mood and affect. Her behavior is normal. Judgment and thought content normal.    MAU Course  Procedures (including critical care time)  Labs Reviewed  POCT FERN TEST - Normal   No results found.   1. Rh negative state in antepartum period   2. Supervision of high risk pregnancy, antepartum   3. Previous cesarean delivery, antepartum   4. Neonatal death in prior pregnancy, currently pregnant       MDM  URINARY INCONTINENCE  STERILE SPEC EXAM NO LEAKING NOTED WITH EXAM, NO LEAKING NOTED WITH VALSALVA, FERN NEG X 2. DISCUSSED FINDINGS WITH PT. CERVIX VISUALLY CLOSED. WILL D/C HOME.

## 2016-07-16 NOTE — Discharge Instructions (Signed)
Premature Rupture and Preterm Premature Rupture of Membranes Premature rupture of membranes (PROM) is when the membranes (amniotic sac) break open before contractions or labor starts. Rupture of membranes is commonly referred to as your water breaking. If PROM occurs before 37 weeks of pregnancy, it is called preterm premature rupture of membranes (PPROM). The amniotic sac holds the fetus, keeps infection out, and performs other important functions. Having the amniotic sac rupture before 37 weeks of pregnancy can lead to serious problems and requires immediate attention by your health care provider. CAUSES  PROM near the end of the pregnancy may be caused by natural weakening of the membranes. PPROM is often due to an infection. Other factors that may be associated with PROM include:  Stretching of the amniotic sac because of carrying multiples or having too much amniotic fluid.  Trauma.  Smoking during pregnancy.  Poor nutrition.  Previous preterm birth.  Vaginal bleeding.  Little to no prenatal care.  Problems with the placenta, such as placenta previa or placental abruption. RISKS OF PROM AND PPROM  Delivering a premature baby.  Getting a serious infection of the placental tissues (chorioamnionitis).  Early detachment of the placenta from the uterus (placental abruption).  Compression of the umbilical cord.  Needing a cesarean birth.  Developing a serious infection after delivery. SIGNS OF PROM OR PPROM   A sudden gush or slow leaking of fluid from the vagina.  Constant wet underwear. Sometimes, women mistake the leaking or wetness for urine, especially if the leak is slow and not a gush of fluid. If there is constant leaking or your underwear continues to get wet, your membranes have likely ruptured. WHAT TO DO IF YOU THINK YOUR MEMBRANES HAVE RUPTURED Call your health care provider right away. You will need to go to the hospital to get checked immediately. WHAT HAPPENS  IF YOU ARE DIAGNOSED WITH PROM OR PPROM? Once you arrive at the hospital, you will have tests done. A cervical exam will be performed to check if the cervix has softened or started to open (dilate). If you are diagnosed with PROM, you may be induced within 24 hours if you are not having contractions. If you are diagnosed with PPROM and are not having contractions, you may be induced depending on your trimester.  If you have PPROM, you:  And your baby will be monitored closely for signs of infection or other complications.  May be given an antibiotic medicine to lower the chances of an infection developing.  May be given a steroid medicine to help mature the baby's lungs faster.  May be given a medicine to stop preterm labor.  May be ordered to be on bed rest at home or in the hospital.  May be induced if complications arise for you or the baby. Your treatment will depend on many factors, such as how far along you are, the development of the baby, and other complications that may arise.   This information is not intended to replace advice given to you by your health care provider. Make sure you discuss any questions you have with your health care provider.   Document Released: 09/03/2005 Document Revised: 06/24/2013 Document Reviewed: 12/23/2012 Elsevier Interactive Patient Education 2016 Elsevier Inc. Ball CorporationBraxton Hicks Contractions Contractions of the uterus can occur throughout pregnancy. Contractions are not always a sign that you are in labor.  WHAT ARE BRAXTON HICKS CONTRACTIONS?  Contractions that occur before labor are called Braxton Hicks contractions, or false labor. Toward the end of pregnancy (  32-34 weeks), these contractions can develop more often and may become more forceful. This is not true labor because these contractions do not result in opening (dilatation) and thinning of the cervix. They are sometimes difficult to tell apart from true labor because these contractions can be  forceful and people have different pain tolerances. You should not feel embarrassed if you go to the hospital with false labor. Sometimes, the only way to tell if you are in true labor is for your health care provider to look for changes in the cervix. If there are no prenatal problems or other health problems associated with the pregnancy, it is completely safe to be sent home with false labor and await the onset of true labor. HOW CAN YOU TELL THE DIFFERENCE BETWEEN TRUE AND FALSE LABOR? False Labor  The contractions of false labor are usually shorter and not as hard as those of true labor.   The contractions are usually irregular.   The contractions are often felt in the front of the lower abdomen and in the groin.   The contractions may go away when you walk around or change positions while lying down.   The contractions get weaker and are shorter lasting as time goes on.   The contractions do not usually become progressively stronger, regular, and closer together as with true labor.  True Labor  Contractions in true labor last 30-70 seconds, become very regular, usually become more intense, and increase in frequency.   The contractions do not go away with walking.   The discomfort is usually felt in the top of the uterus and spreads to the lower abdomen and low back.   True labor can be determined by your health care provider with an exam. This will show that the cervix is dilating and getting thinner.  WHAT TO REMEMBER  Keep up with your usual exercises and follow other instructions given by your health care provider.   Take medicines as directed by your health care provider.   Keep your regular prenatal appointments.   Eat and drink lightly if you think you are going into labor.   If Braxton Hicks contractions are making you uncomfortable:   Change your position from lying down or resting to walking, or from walking to resting.   Sit and rest in a tub of  warm water.   Drink 2-3 glasses of water. Dehydration may cause these contractions.   Do slow and deep breathing several times an hour.  WHEN SHOULD I SEEK IMMEDIATE MEDICAL CARE? Seek immediate medical care if:  Your contractions become stronger, more regular, and closer together.   You have fluid leaking or gushing from your vagina.   You have a fever.   You pass blood-tinged mucus.   You have vaginal bleeding.   You have continuous abdominal pain.   You have low back pain that you never had before.   You feel your baby's head pushing down and causing pelvic pressure.   Your baby is not moving as much as it used to.    This information is not intended to replace advice given to you by your health care provider. Make sure you discuss any questions you have with your health care provider.   Document Released: 09/03/2005 Document Revised: 09/08/2013 Document Reviewed: 06/15/2013 Elsevier Interactive Patient Education [employer]2016 Elsevier Inc.

## 2016-07-18 ENCOUNTER — Ambulatory Visit (INDEPENDENT_AMBULATORY_CARE_PROVIDER_SITE_OTHER): Payer: 59 | Admitting: Obstetrics and Gynecology

## 2016-07-18 VITALS — BP 121/72 | HR 73 | Wt 216.5 lb

## 2016-07-18 DIAGNOSIS — O26899 Other specified pregnancy related conditions, unspecified trimester: Secondary | ICD-10-CM

## 2016-07-18 DIAGNOSIS — O34219 Maternal care for unspecified type scar from previous cesarean delivery: Secondary | ICD-10-CM

## 2016-07-18 DIAGNOSIS — Z113 Encounter for screening for infections with a predominantly sexual mode of transmission: Secondary | ICD-10-CM

## 2016-07-18 DIAGNOSIS — O0990 Supervision of high risk pregnancy, unspecified, unspecified trimester: Secondary | ICD-10-CM

## 2016-07-18 DIAGNOSIS — O09299 Supervision of pregnancy with other poor reproductive or obstetric history, unspecified trimester: Secondary | ICD-10-CM

## 2016-07-18 DIAGNOSIS — O09293 Supervision of pregnancy with other poor reproductive or obstetric history, third trimester: Secondary | ICD-10-CM

## 2016-07-18 DIAGNOSIS — Z6791 Unspecified blood type, Rh negative: Secondary | ICD-10-CM

## 2016-07-18 NOTE — Progress Notes (Signed)
Prenatal Visit Note Date: 07/18/2016 [facility]  Subjective:  Erin BertholdChristina [name] is a 25 y.o. G2P0100 at 2989w0d being seen today for ongoing prenatal care.  She is currently monitored for the following issues for this high-risk pregnancy and has Supervision of high risk pregnancy, antepartum; Previous cesarean delivery, antepartum; Neonatal death in prior pregnancy, currently pregnant; Rh negative state in antepartum period; and ASCUS with positive high risk HPV on her problem list.  Patient reports no complaints.  No LOF or discharge since MAU visit Contractions: Irregular. Vag. Bleeding: None.  Movement: Present. Denies leaking of fluid.   The following portions of the patient's history were reviewed and updated as appropriate: allergies, current medications, past family history, past medical history, past social history, past surgical history and problem list. Problem list updated.  Objective:   Vitals:   07/18/16 1151  BP: 121/72  Pulse: 73  Weight: 216 lb 8 oz (98.2 kg)    Fetal Status: Fetal Heart Rate (bpm): 156 Fundal Height: 37 cm Movement: Present  Presentation: Vertex  General:  Alert, oriented and cooperative. Patient is in no acute distress.  Skin: Skin is warm and dry. No rash noted.   Cardiovascular: Normal heart rate noted  Respiratory: Normal respiratory effort, no problems with respiration noted  Abdomen: Soft, gravid, appropriate for gestational age. Pain/Pressure: Present     Pelvic:  Cervical exam deferred      EGBUS normal  Extremities: Normal range of motion.  Edema: Trace  Mental Status: Normal mood and affect. Normal behavior. Normal judgment and thought content.   Urinalysis:      Assessment and Plan:  Pregnancy: G2P0100 at 5989w0d  1. Supervision of high risk pregnancy, antepartum Routine care - Culture, beta strep (group b only) - [facility]Chlam[facility]  2. H/o c-section Patient having 2nd  thoughts about doing TOLAC. D/w her re: r/b/a including pros and cons and I told her that both options are reasonable as long as she feels properly informed. They think they want to have one more child and risks of repeat with subsequent pregnancies d/w her and VBAC calculator gives about 70% success rate. After d/w husband, she'd like to do a repeat c-section at 39wks and if she comes in in labor to consider TOLAC. Request sent for rpt c-section scheduling for 39/0-40/0  Term labor symptoms and general obstetric precautions including but not limited to vaginal bleeding, contractions, leaking of fluid and fetal movement were reviewed in detail with the patient. Please refer to After Visit Summary for other counseling recommendations.  Return in about 1 week (around 07/25/2016).    Bingharlie Bard Haupert, MD

## 2016-07-19 ENCOUNTER — Encounter (HOSPITAL_COMMUNITY): Payer: Self-pay

## 2016-07-19 ENCOUNTER — Encounter (HOSPITAL_COMMUNITY): Payer: Self-pay | Admitting: *Deleted

## 2016-07-19 ENCOUNTER — Telehealth (HOSPITAL_COMMUNITY): Payer: Self-pay | Admitting: *Deleted

## 2016-07-19 LAB — GC/CHLAMYDIA PROBE AMP (~~LOC~~) NOT AT ARMC
Chlamydia: NEGATIVE
Neisseria Gonorrhea: NEGATIVE

## 2016-07-19 NOTE — Telephone Encounter (Signed)
Preadmission screen  

## 2016-07-20 ENCOUNTER — Telehealth: Payer: Self-pay

## 2016-07-20 LAB — CULTURE, BETA STREP (GROUP B ONLY)

## 2016-07-20 NOTE — Telephone Encounter (Signed)
Pt called the front desk requesting a call back from a nurse.  Called pt and pt wanted to verify if it was correct that her c-section is scheduled on a Sunday because she did not think that they did c-sections on the weekend. She also asked why did her c-section get scheduled on that Sunday and not next Wednesday as the provider had stated.  I explained to the pt that it is not unusual to have a c-section scheduled on the weekend.  There is a provider in the hospital 24-7 and that her surgery was more than likely scheduled when there was availability.  Pt stated understanding.  Pt then asked if she could be taken out of work starting today because she is just so uncomfortable. I advised the pt to contact her HR and discuss with them since she is wanting to be out of work sooner and that a provider has not medically indicated for her to be out of work.  Pt agreed with no further questions.

## 2016-07-23 ENCOUNTER — Telehealth: Payer: Self-pay | Admitting: General Practice

## 2016-07-23 NOTE — Telephone Encounter (Signed)
Patient called and left message stating she has a question about her c section date. Patient states her and the doctor discussed the c-section date being 11/8 and she just received a call that it is scheduled for 11/12. Sent a message to [place] to inquire about surgery date. Called patient and discussed I am returning her phone call and informed patient that I have sent a message to the person who schedules our surgeries and will call her back whenever I hear from her. Patient verbalized understanding and states that's okay, it's fine. Patient had no questions

## 2016-07-25 ENCOUNTER — Ambulatory Visit (INDEPENDENT_AMBULATORY_CARE_PROVIDER_SITE_OTHER): Payer: 59 | Admitting: Obstetrics & Gynecology

## 2016-07-25 VITALS — BP 122/69 | HR 83 | Wt 219.5 lb

## 2016-07-25 DIAGNOSIS — O0990 Supervision of high risk pregnancy, unspecified, unspecified trimester: Secondary | ICD-10-CM

## 2016-07-25 DIAGNOSIS — O0993 Supervision of high risk pregnancy, unspecified, third trimester: Secondary | ICD-10-CM

## 2016-07-25 DIAGNOSIS — O34219 Maternal care for unspecified type scar from previous cesarean delivery: Secondary | ICD-10-CM

## 2016-07-25 NOTE — Patient Instructions (Addendum)
[facility]30 am. Nothing to eat or drink after midnight.    Return to clinic for any scheduled appointments or obstetric concerns, or go to MAU for evaluation  Cesarean Delivery Cesarean delivery is the birth of a baby through a cut (incision) in the abdomen and womb (uterus).  LET [facility] KNOW ABOUT:  All medicines you are taking, including vitamins, herbs, eye drops, creams, and over-the-counter medicines.  Previous problems you or members of your family have had with the use of anesthetics.  Any bleeding or blood clotting disorders you have.  Family history of blood clots or bleeding disorders.  Any history of deep vein thrombosis (DVT) or pulmonary embolism (PE).  Previous surgeries you have had.  Medical conditions you have.  Any allergies you have.  Complicationsinvolving the pregnancy. RISKS AND COMPLICATIONS  Generally, this is a safe procedure. However, as with any procedure, complications can occur. Possible complications include:  Bleeding.  Infection.  Blood clots.  Injury to surrounding organs.  Problems with anesthesia.  Injury to the baby. BEFORE THE PROCEDURE   You may be given an antacid medicine to drink. This will prevent acid contents in your stomach from going into your lungs if you vomit during the surgery.  You may be given an antibiotic medicine to prevent infection. PROCEDURE   To prevent infection of your incision:  Hair may be removed from your pubic area if it is near your incision.  The skin of your pubic area and lower abdomen will be cleaned with a germ-killing solution (antiseptic).  A tube (Foley catheter) will be placed in your bladder to drain your urine from your bladder into a bag. This keeps your bladder empty during surgery.  An IV tube will be placed in your vein.  You may be given medicine to numb the lower half of your body (regional anesthetic). If you were in labor, you  may have already had an epidural in place which can be used in both labor and cesarean delivery. You may possibly be given medicine to make you sleep (general anesthetic) though this is not as common.  Your heart rate and your baby's heart rate will be monitored.  An incision will be made in your abdomen that extends to your uterus. There are 2 basic kinds of incisions:  The horizontal (transverse) incision. Horizontal incisions are from side to side and are used for most routine cesarean deliveries.  The vertical incision. The vertical incision is from the top of the abdomen to the bottom and is less commonly used. It is often done for women who have a serious complication (extreme prematurity) or under emergency situations.  The horizontal and vertical incisions may both be used at the same time. However, this is very uncommon.  An incision is then made in your uterus to deliver the baby.  Your baby will be delivered.  Your health care provider may place the baby on your chest. It is important to keep the baby warm. Your health care provider will dry off the baby, place the baby directly on your bare skin, and cover the baby with warm, dry blankets.  Both incisions will be closed with absorbable stitches. AFTER THE PROCEDURE   If you were awake during the surgery, you will see your baby right away. If you were asleep, you will see your baby as soon as you are awake.  You may breastfeed your baby after surgery.  You may be able  to get up and walk the same day as the surgery. If you need to stay in bed for a period of time, you will receive help to turn, cough, and take deep breaths after surgery. This helps prevent lung problems such as pneumonia.  Do not get out of bed alone the first time after surgery. You will need help getting out of bed until you are able to do this by yourself.  You may be able to shower the day after your cesarean delivery. After the bandage (dressing) is  taken off the incision site, a nurse will assist you to shower if you would like help.  You may be directed to take actions to help prevent blood clots in your legs. These may include:  Walking shortly after surgery, with someone assisting you. Moving around after surgery helps to improve blood flow.  Wearing compression stockings or using different types of devices.  Taking medicines to thin your blood (anticoagulants) if you are at high risk for DVT or PE.  Save any blood clots that you pass from your vagina. If you pass a clot while on the toilet, do not flush it. Call for the nurse. Tell the nurse if you think you are bleeding too much or passing too many clots.  You will be given medicine for pain and nausea as needed. Let your health care providers know if you are hurting. You may also be given an antibiotic to prevent an infection.  Your IV tube will be taken out when you are drinking a reasonable amount of fluids. The Foley catheter is taken out when you are up and walking.  If your blood type is Rh negative and your baby's blood type is Rh positive, you will be given a shot of anti-D immune globulin. This shot prevents you from having Rh problems with a future pregnancy. You should get the shot even if you had your tubes tied (tubal ligation).  If you are allowed to take the baby for a walk, place the baby in the bassinet and push it.   This information is not intended to replace advice given to you by your health care provider. Make sure you discuss any questions you have with your health care provider.   Document Released: 09/03/2005 Document Revised: 05/25/2015 Document Reviewed: 04/30/2012 Elsevier Interactive Patient Education [employer]2016 Elsevier Inc.

## 2016-07-25 NOTE — Progress Notes (Signed)
   PRENATAL VISIT NOTE  Subjective:  Erin [name] is a 25 y.o. G2P0100 at 8068w0d being seen today for ongoing prenatal care.  She is currently monitored for the following issues for this low-risk pregnancy and has Supervision of high risk pregnancy, antepartum; Previous cesarean delivery, antepartum; Neonatal death in prior pregnancy, currently pregnant; Rh negative state in antepartum period; and ASCUS with positive high risk HPV on her problem list.  Patient reports no complaints. Wonders why RCS is scheduled on 07/29/16 at 7558w4d.  Contractions: Irritability. Vag. Bleeding: None.  Movement: Present. Denies leaking of fluid.   The following portions of the patient's history were reviewed and updated as appropriate: allergies, current medications, past family history, past medical history, past social history, past surgical history and problem list. Problem list updated.  Objective:   Vitals:   07/25/16 0811  BP: 122/69  Pulse: 83  Weight: 219 lb 8 oz (99.6 kg)    Fetal Status: Fetal Heart Rate (bpm): 146 Fundal Height: 39 cm Movement: Present     General:  Alert, oriented and cooperative. Patient is in no acute distress.  Skin: Skin is warm and dry. No rash noted.   Cardiovascular: Normal heart rate noted  Respiratory: Normal respiratory effort, no problems with respiration noted  Abdomen: Soft, gravid, appropriate for gestational age. Pain/Pressure: Present     Pelvic:  Cervical exam deferred        Extremities: Normal range of motion.  Edema: Trace  Mental Status: Normal mood and affect. Normal behavior. Normal judgment and thought content.   Assessment and Plan:  Pregnancy: G2P0100 at 4568w0d  1. Supervision of high risk pregnancy, antepartum 2. Previous cesarean delivery, antepartum  Repeat cesarean section rescheduled tomorrow 07/26/16 at 11:30 am (was originally scheduled 07/29/16).  Patient given instructions to come in at 0930, NPO after midnight tonight.  Term labor  symptoms and general obstetric precautions including but not limited to vaginal bleeding, contractions, leaking of fluid and fetal movement were reviewed in detail with the patient. Please refer to After Visit Summary for other counseling recommendations.  Return in about 6 weeks (around 09/05/2016) for Postpartum check.   Tereso NewcomerUgonna A Amayrani Bennick, MD

## 2016-07-26 ENCOUNTER — Encounter (HOSPITAL_COMMUNITY): Payer: Self-pay

## 2016-07-26 ENCOUNTER — Inpatient Hospital Stay (HOSPITAL_COMMUNITY): Payer: Medicaid Other | Admitting: Anesthesiology

## 2016-07-26 ENCOUNTER — Encounter (HOSPITAL_COMMUNITY)
Admission: RE | Disposition: A | Discharge status: Home / Self Care | Payer: Self-pay | Source: Ambulatory Visit | Attending: Obstetrics & Gynecology

## 2016-07-26 ENCOUNTER — Inpatient Hospital Stay (HOSPITAL_COMMUNITY)
Admission: RE | Admit: 2016-07-26 | Discharge: 2016-07-28 | DRG: 766 | Disposition: A | Discharge status: Home / Self Care | Payer: Medicaid Other | Source: Ambulatory Visit | Attending: Obstetrics & Gynecology | Admitting: Obstetrics & Gynecology

## 2016-07-26 DIAGNOSIS — O26893 Other specified pregnancy related conditions, third trimester: Secondary | ICD-10-CM | POA: Diagnosis present

## 2016-07-26 DIAGNOSIS — O34211 Maternal care for low transverse scar from previous cesarean delivery: Secondary | ICD-10-CM | POA: Diagnosis present

## 2016-07-26 DIAGNOSIS — Z98891 History of uterine scar from previous surgery: Secondary | ICD-10-CM

## 2016-07-26 DIAGNOSIS — Z6791 Unspecified blood type, Rh negative: Secondary | ICD-10-CM | POA: Diagnosis not present

## 2016-07-26 DIAGNOSIS — Z8249 Family history of ischemic heart disease and other diseases of the circulatory system: Secondary | ICD-10-CM

## 2016-07-26 DIAGNOSIS — Z3A39 39 weeks gestation of pregnancy: Secondary | ICD-10-CM

## 2016-07-26 LAB — CBC
HCT: 40.5 % (ref 36.0–46.0)
HEMOGLOBIN: 13.7 g/dL (ref 12.0–15.0)
MCH: 28.6 pg (ref 26.0–34.0)
MCHC: 33.8 g/dL (ref 30.0–36.0)
MCV: 84.6 fL (ref 78.0–100.0)
PLATELETS: 196 10*3/uL (ref 150–400)
RBC: 4.79 MIL/uL (ref 3.87–5.11)
RDW: 13.9 % (ref 11.5–15.5)
WBC: 9.8 10*3/uL (ref 4.0–10.5)

## 2016-07-26 SURGERY — Surgical Case
Anesthesia: Spinal | Site: Abdomen

## 2016-07-26 MED ORDER — ONDANSETRON HCL 4 MG/2ML IJ SOLN
INTRAMUSCULAR | Status: AC
  Filled 2016-07-26: qty 2

## 2016-07-26 MED ORDER — OXYCODONE HCL 5 MG PO TABS: 10.0000 mg | ORAL_TABLET | ORAL | Status: DC | PRN

## 2016-07-26 MED ORDER — DIPHENHYDRAMINE HCL 25 MG PO CAPS: 25.0000 mg | ORAL_CAPSULE | Freq: Four times a day (QID) | ORAL | Status: DC | PRN

## 2016-07-26 MED ORDER — PHENYLEPHRINE 8 MG IN D5W 100 ML (0.08MG/ML) PREMIX OPTIME
INJECTION | INTRAVENOUS | Status: DC | PRN
  Administered 2016-07-26: 60 ug/min via INTRAVENOUS

## 2016-07-26 MED ORDER — NALBUPHINE HCL 10 MG/ML IJ SOLN
INTRAMUSCULAR | Status: AC
  Filled 2016-07-26: qty 1

## 2016-07-26 MED ORDER — FENTANYL CITRATE (PF) 100 MCG/2ML IJ SOLN
INTRAMUSCULAR | Status: AC
  Filled 2016-07-26: qty 2

## 2016-07-26 MED ORDER — SIMETHICONE 80 MG PO CHEW
80.0000 mg | CHEWABLE_TABLET | Freq: Three times a day (TID) | ORAL | Status: DC
  Administered 2016-07-26 – 2016-07-28 (×6): 80 mg via ORAL
  Filled 2016-07-26 (×6): qty 1

## 2016-07-26 MED ORDER — COCONUT OIL OIL: 1.0000 "application " | TOPICAL_OIL | Status: DC | PRN

## 2016-07-26 MED ORDER — PRENATAL MULTIVITAMIN CH
1.0000 | ORAL_TABLET | Freq: Every day | ORAL | Status: DC
  Administered 2016-07-27 – 2016-07-28 (×2): 1 via ORAL
  Filled 2016-07-26 (×2): qty 1

## 2016-07-26 MED ORDER — OXYCODONE HCL 5 MG PO TABS
5.0000 mg | ORAL_TABLET | ORAL | Status: DC | PRN
  Administered 2016-07-27 (×2): 5 mg via ORAL
  Filled 2016-07-26 (×2): qty 1

## 2016-07-26 MED ORDER — OXYTOCIN 10 UNIT/ML IJ SOLN
INTRAMUSCULAR | Status: AC
  Filled 2016-07-26: qty 4

## 2016-07-26 MED ORDER — SIMETHICONE 80 MG PO CHEW
80.0000 mg | CHEWABLE_TABLET | ORAL | Status: DC
  Administered 2016-07-27 (×2): 80 mg via ORAL
  Filled 2016-07-26 (×2): qty 1

## 2016-07-26 MED ORDER — NALBUPHINE HCL 10 MG/ML IJ SOLN
5.0000 mg | Freq: Once | INTRAMUSCULAR | Status: AC | PRN
  Administered 2016-07-26: 5 mg via SUBCUTANEOUS

## 2016-07-26 MED ORDER — SENNOSIDES-DOCUSATE SODIUM 8.6-50 MG PO TABS
2.0000 | ORAL_TABLET | ORAL | Status: DC
  Administered 2016-07-27 (×2): 2 via ORAL
  Filled 2016-07-26 (×2): qty 2

## 2016-07-26 MED ORDER — SIMETHICONE 80 MG PO CHEW: 80.0000 mg | CHEWABLE_TABLET | ORAL | Status: DC | PRN

## 2016-07-26 MED ORDER — PHENYLEPHRINE 8 MG IN D5W 100 ML (0.08MG/ML) PREMIX OPTIME
INJECTION | INTRAVENOUS | Status: AC
  Filled 2016-07-26: qty 100

## 2016-07-26 MED ORDER — SCOPOLAMINE 1 MG/3DAYS TD PT72
MEDICATED_PATCH | TRANSDERMAL | Status: AC
  Administered 2016-07-26: 1.5 mg via TRANSDERMAL
  Filled 2016-07-26: qty 1

## 2016-07-26 MED ORDER — SCOPOLAMINE 1 MG/3DAYS TD PT72
1.0000 | MEDICATED_PATCH | Freq: Once | TRANSDERMAL | Status: DC
  Administered 2016-07-26: 1.5 mg via TRANSDERMAL

## 2016-07-26 MED ORDER — BUPIVACAINE HCL (PF) 0.5 % IJ SOLN
INTRAMUSCULAR | Status: DC | PRN
  Administered 2016-07-26: 30 mL

## 2016-07-26 MED ORDER — OXYTOCIN 40 UNITS IN LACTATED RINGERS INFUSION - SIMPLE MED
INTRAVENOUS | Status: DC | PRN
  Administered 2016-07-26: 40 [IU] via INTRAVENOUS

## 2016-07-26 MED ORDER — ACETAMINOPHEN 325 MG PO TABS
650.0000 mg | ORAL_TABLET | ORAL | Status: DC | PRN
  Administered 2016-07-26: 650 mg via ORAL
  Filled 2016-07-26: qty 2

## 2016-07-26 MED ORDER — ZOLPIDEM TARTRATE 5 MG PO TABS: 5.0000 mg | ORAL_TABLET | Freq: Every evening | ORAL | Status: DC | PRN

## 2016-07-26 MED ORDER — ONDANSETRON HCL 4 MG/2ML IJ SOLN
INTRAMUSCULAR | Status: DC | PRN
  Administered 2016-07-26: 4 mg via INTRAVENOUS

## 2016-07-26 MED ORDER — LACTATED RINGERS IV SOLN
INTRAVENOUS | Status: DC
  Administered 2016-07-26: 125 mL/h via INTRAVENOUS
  Administered 2016-07-27 (×2): via INTRAVENOUS

## 2016-07-26 MED ORDER — LACTATED RINGERS IV SOLN
INTRAVENOUS | Status: DC | PRN
  Administered 2016-07-26 (×2): via INTRAVENOUS

## 2016-07-26 MED ORDER — IBUPROFEN 600 MG PO TABS
600.0000 mg | ORAL_TABLET | Freq: Four times a day (QID) | ORAL | Status: DC
  Administered 2016-07-26 – 2016-07-28 (×8): 600 mg via ORAL
  Filled 2016-07-26 (×8): qty 1

## 2016-07-26 MED ORDER — DIBUCAINE 1 % RE OINT: 1.0000 "application " | TOPICAL_OINTMENT | RECTAL | Status: DC | PRN

## 2016-07-26 MED ORDER — PHENYLEPHRINE 40 MCG/ML (10ML) SYRINGE FOR IV PUSH (FOR BLOOD PRESSURE SUPPORT)
PREFILLED_SYRINGE | INTRAVENOUS | Status: AC
  Filled 2016-07-26: qty 10

## 2016-07-26 MED ORDER — WITCH HAZEL-GLYCERIN EX PADS: 1.0000 "application " | MEDICATED_PAD | CUTANEOUS | Status: DC | PRN

## 2016-07-26 MED ORDER — CEFAZOLIN SODIUM-DEXTROSE 2-4 GM/100ML-% IV SOLN
2.0000 g | Freq: Three times a day (TID) | INTRAVENOUS | Status: DC
  Administered 2016-07-26: 2 g via INTRAVENOUS
  Filled 2016-07-26 (×5): qty 100

## 2016-07-26 MED ORDER — LACTATED RINGERS IV SOLN
Freq: Once | INTRAVENOUS | Status: AC
  Administered 2016-07-26: 1000 mL/h via INTRAVENOUS

## 2016-07-26 MED ORDER — LACTATED RINGERS IV SOLN
INTRAVENOUS | Status: DC
  Administered 2016-07-26: 12:00:00 via INTRAVENOUS

## 2016-07-26 MED ORDER — HYDROMORPHONE HCL 1 MG/ML IJ SOLN
INTRAMUSCULAR | Status: AC
  Filled 2016-07-26: qty 1

## 2016-07-26 MED ORDER — OXYTOCIN 40 UNITS IN LACTATED RINGERS INFUSION - SIMPLE MED: 2.5000 [IU]/h | INTRAVENOUS | Status: AC

## 2016-07-26 MED ORDER — PHENYLEPHRINE HCL 10 MG/ML IJ SOLN
INTRAMUSCULAR | Status: DC | PRN
  Administered 2016-07-26: 60 ug via INTRAVENOUS

## 2016-07-26 MED ORDER — TETANUS-DIPHTH-ACELL PERTUSSIS 5-2.5-18.5 LF-MCG/0.5 IM SUSP: 0.5000 mL | Freq: Once | INTRAMUSCULAR | Status: DC

## 2016-07-26 MED ORDER — MORPHINE SULFATE-NACL 0.5-0.9 MG/ML-% IV SOSY
PREFILLED_SYRINGE | INTRAVENOUS | Status: AC
Start: 2016-07-26 — End: 2016-07-26
  Filled 2016-07-26: qty 1

## 2016-07-26 MED ORDER — HYDROMORPHONE HCL 1 MG/ML IJ SOLN
0.2500 mg | INTRAMUSCULAR | Status: DC | PRN
  Administered 2016-07-26 (×2): 0.25 mg via INTRAVENOUS

## 2016-07-26 MED ORDER — MENTHOL 3 MG MT LOZG: 1.0000 | LOZENGE | OROMUCOSAL | Status: DC | PRN

## 2016-07-26 MED ORDER — BUPIVACAINE HCL (PF) 0.5 % IJ SOLN
INTRAMUSCULAR | Status: AC
  Filled 2016-07-26: qty 30

## 2016-07-26 MED ORDER — NALBUPHINE HCL 10 MG/ML IJ SOLN: 5.0000 mg | Freq: Once | INTRAMUSCULAR | Status: AC | PRN

## 2016-07-26 MED ORDER — MEPERIDINE HCL 25 MG/ML IJ SOLN: 6.2500 mg | INTRAMUSCULAR | Status: DC | PRN

## 2016-07-26 SURGICAL SUPPLY — 32 items
BARRIER ADHS 3X4 INTERCEED (GAUZE/BANDAGES/DRESSINGS) IMPLANT
BENZOIN TINCTURE PRP APPL 2/3 (GAUZE/BANDAGES/DRESSINGS) ×3 IMPLANT
CHLORAPREP W/TINT 26ML (MISCELLANEOUS) ×3 IMPLANT
CLAMP CORD UMBIL (MISCELLANEOUS) IMPLANT
CLOSURE STERI STRIP 1/2 X4 (GAUZE/BANDAGES/DRESSINGS) ×3 IMPLANT
CLOTH BEACON ORANGE TIMEOUT ST (SAFETY) ×3 IMPLANT
DRSG OPSITE POSTOP 4X10 (GAUZE/BANDAGES/DRESSINGS) ×3 IMPLANT
ELECT REM PT RETURN 9FT ADLT (ELECTROSURGICAL) ×3
ELECTRODE REM PT RTRN 9FT ADLT (ELECTROSURGICAL) ×1 IMPLANT
EXTRACTOR VACUUM KIWI (MISCELLANEOUS) IMPLANT
GLOVE BIO SURGEON STRL SZ 6.5 (GLOVE) ×2 IMPLANT
GLOVE BIO SURGEONS STRL SZ 6.5 (GLOVE) ×1
GLOVE BIOGEL PI IND STRL 7.0 (GLOVE) ×2 IMPLANT
GLOVE BIOGEL PI INDICATOR 7.0 (GLOVE) ×4
GOWN STRL REUS W/TWL LRG LVL3 (GOWN DISPOSABLE) ×6 IMPLANT
KIT ABG SYR 3ML LUER SLIP (SYRINGE) IMPLANT
NEEDLE HYPO 22GX1.5 SAFETY (NEEDLE) IMPLANT
NEEDLE HYPO 25X5/8 SAFETYGLIDE (NEEDLE) IMPLANT
NS IRRIG 1000ML POUR BTL (IV SOLUTION) ×3 IMPLANT
PACK C SECTION WH (CUSTOM PROCEDURE TRAY) ×3 IMPLANT
PAD OB MATERNITY 4.3X12.25 (PERSONAL CARE ITEMS) ×3 IMPLANT
PENCIL SMOKE EVAC W/HOLSTER (ELECTROSURGICAL) ×3 IMPLANT
RETRACTOR WND ALEXIS 25 LRG (MISCELLANEOUS) IMPLANT
RTRCTR WOUND ALEXIS 25CM LRG (MISCELLANEOUS)
SUT PLAIN 2 0 XLH (SUTURE) ×3 IMPLANT
SUT VIC AB 0 CT1 36 (SUTURE) ×18 IMPLANT
SUT VIC AB 2-0 CT1 27 (SUTURE) ×2
SUT VIC AB 2-0 CT1 TAPERPNT 27 (SUTURE) ×1 IMPLANT
SUT VIC AB 4-0 PS2 27 (SUTURE) ×3 IMPLANT
SYR CONTROL 10ML LL (SYRINGE) IMPLANT
TOWEL OR 17X24 6PK STRL BLUE (TOWEL DISPOSABLE) ×3 IMPLANT
TRAY FOLEY CATH SILVER 14FR (SET/KITS/TRAYS/PACK) IMPLANT

## 2016-07-26 NOTE — Anesthesia Preprocedure Evaluation (Signed)

## 2016-07-26 NOTE — Anesthesia Postprocedure Evaluation (Signed)
Anesthesia Post Note  Patient: Erin [name]  Procedure(s) Performed: Procedure(s) (LRB): CESAREAN SECTION (N/A)  Patient location during evaluation: Mother Baby Anesthesia Type: Spinal Level of consciousness: awake Pain management: satisfactory to patient Vital Signs Assessment: post-procedure vital signs reviewed and stable Respiratory status: spontaneous breathing Cardiovascular status: stable Anesthetic complications: no     Last Vitals:  Vitals:   07/26/16 1641 07/26/16 1800  BP: 126/68 (!) 122/49  Pulse: 66 69  Resp: 18 18  Temp: 37 C 36.9 C    Last Pain:  Vitals:   07/26/16 1803  TempSrc:   PainSc: 3    Pain Goal: Patients Stated Pain Goal: 0 (07/26/16 1345)               Cephus ShellingBURGER,Zamariya Neal

## 2016-07-26 NOTE — Lactation Note (Signed)
This note was copied from a baby's chart. Lactation Consultation Note  Patient Name: Erin Cooper RenderChristina Sees TDDUK'GToday's Date: 07/26/2016 Reason for consult: Initial assessment   Initial assessment with first time mom of 1 hour old infant. Infant was STS with mom and cueing to feed. Mom lost her first son 30 minutes after delivery. She does reports her milk came in with that child.   Mom with small firm breasts and small short shaft nipples flat that evert slightly with stimulation. Mom reports + breast changes with pregnancy. Infant noted to have short lingual frenulum, lip frenulum, and high palate. He is noted to pull his tongue back behind gumline when suckling on gloved finger. He can extend tongue over gumline when gets into a rhythmic suckling pattern. Dr. Kennon RoundsHaney informed parents that infant has a lingual frenulum and that we will monitor infant's feeding.   Assisted mom in latching infant to breast. Infant pulled on and off the breast and had difficulty staying latched. After about 30 minutes he was able to get into short sucking bursts at he breasts. Mom denied pain/pinching with feeding. Infant was at the breast for > 45 minutes with about 15 minutes of active suckling. Infant then fell asleep and was left STS with mom.  Showed mom how to hand express into a spoon and a large gtt colostrum was noted to left breast, colostrum was fed to infant on finger. Discussed with mom and dad to perform suck training prior to each latch and showed them how to perform. Enc mom to use manual pump to evert nipples prior to latch. Manual pump and breast shells placed in PP room and will follow up with mother.  LC Brochure and BF Resources Handout given, mom informed of IP/OP Services, BF Support Groups and LC phone #. Enc parent to call out to desk for feeding assistance as needed. Report to Vivi MartensAshley Billings, RN.      Maternal Data Formula Feeding for Exclusion: No Has patient been taught Hand Expression?:  Yes Does the patient have breastfeeding experience prior to this delivery?: No  Feeding Feeding Type: Breast Fed Length of feed: 15 min  LATCH Score/Interventions Latch: Repeated attempts needed to sustain latch, nipple held in mouth throughout feeding, stimulation needed to elicit sucking reflex. Intervention(s): Adjust position;Assist with latch;Breast massage;Breast compression  Audible Swallowing: A few with stimulation Intervention(s): Skin to skin;Hand expression;Alternate breast massage  Type of Nipple: Flat Intervention(s): Shells;Hand pump  Comfort (Breast/Nipple): Soft / non-tender     Hold (Positioning): Assistance needed to correctly position infant at breast and maintain latch. Intervention(s): Breastfeeding basics reviewed;Support Pillows;Skin to skin  LATCH Score: 6  Lactation Tools Discussed/Used WIC Program: No   Consult Status Consult Status: Follow-up Date: 07/26/16 Follow-up type: In-patient    Silas FloodSharon S Mardene Lessig 07/26/2016, 2:05 PM

## 2016-07-26 NOTE — H&P (Signed)
Obstetric Preoperative History and Physical  Erin [name] is a 25 y.o. G2P0100 with IUP at 7228w1d presenting for scheduled cesarean section. Patient has one previous c-section for placenta abruption and fetal distress. No acute concerns.   Prenatal Course [facility]  with onset of care at 7 weeks and 5 days Pregnancy complications or risks: Patient Active Problem List   Diagnosis Date Noted  . ASCUS with positive high risk HPV 01/23/2016  . Rh negative state in antepartum period 12/20/2015  . Supervision of high risk pregnancy, antepartum 12/19/2015  . Previous cesarean delivery, antepartum 12/19/2015  . Neonatal death in prior pregnancy, currently pregnant 12/19/2015   She plans to breastfeed She desires no method for postpartum contraception.   Prenatal labs and studies: ABO, Rh: --/--/O NEG (11/09 0950) Antibody: PENDING (11/09 0950) Rubella: 1.31 (04/03 0957) RPR: NON REAC (08/22 1456)  HBsAg: NEGATIVE (04/03 0957)  HIV: NONREACTIVE (08/22 1456)  GBS:  1 hr Glucola  157 w/ normal 3 hour Genetic screening normal Anatomy US normal  Prenatal Transfer Tool  Maternal Diabetes: No Genetic Screening: Normal Maternal Ultrasounds/Referrals: Normal Fetal Ultrasounds or other Referrals:  None Maternal Substance Abuse:  No Significant Maternal Medications:  None Significant Maternal Lab Results: None  History reviewed. No pertinent past medical history.  Past Surgical History:  Procedure Laterality Date  . CESAREAN SECTION    . egg donor x2       OB History  Gravida Para Term Preterm AB Living  2 1 0 1   0  SAB TAB Ectopic Multiple Live Births          1    # Outcome Date GA Lbr Len/2nd Weight Sex Delivery Anes PTL Lv  2 Current           1 Preterm 2015        ND    Obstetric Comments  G1: 26wk stat pLTCS @ [facility] (VB, ?abruption, oligo bradycardia and multiple fetal anomalies). Demised neonate.     Social History   Social  History  . Marital status: Married    Spouse name: N/A  . Number of children: N/A  . Years of education: N/A   Social History Main Topics  . Smoking status: Never Smoker  . Smokeless tobacco: Never Used  . Alcohol use No  . Drug use: No  . Sexual activity: Yes   Other Topics Concern  . None   Social History Narrative  . None    Family History  Problem Relation Age of Onset  . Cancer Mother   . Hypertension Father   . Cancer Father     Prescriptions Prior to Admission  Medication Sig Dispense Refill Last Dose  . prenatal vitamin w/FE, FA (PRENATAL 1 + 1) 27-1 MG TABS tablet Take 1 tablet by mouth daily.    07/25/2016 at unknown    Allergies  Allergen Reactions  . Sulfa Antibiotics Rash    Review of Systems: Negative except for what is mentioned in HPI.  Physical Exam: BP 131/86   Pulse 77   Temp 98.6 F (37 C) (Oral)   Resp 16   LMP 10/06/2015   SpO2 98%   CONSTITUTIONAL: Well-developed, well-nourished female in no acute distress.  HENT:  Normocephalic, atraumatic, External right and left ear normal. Oropharynx is clear and moist EYES: Conjunctivae and EOM are normal. Pupils are equal, round, and reactive to light. No scleral icterus.  NECK: Normal range of motion, supple, no  masses SKIN: Skin is warm and dry. No rash noted. Not diaphoretic. No erythema. No pallor. NEUROLGIC: Alert and oriented to person, place, and time. Normal reflexes, muscle tone coordination. No cranial nerve deficit noted. PSYCHIATRIC: Normal mood and affect. Normal behavior. Normal judgment and thought content. CARDIOVASCULAR: Normal heart rate noted, regular rhythm RESPIRATORY: Effort and breath sounds normal, no problems with respiration noted ABDOMEN: Soft, nontender, nondistended, gravid. Well-healed Pfannenstiel incision. PELVIC: Deferred MUSCULOSKELETAL: Normal range of motion. No edema and no tenderness. 2+ distal pulses.   Pertinent Labs/Studies:   Results for orders placed  or performed during the hospital encounter of 07/26/16 (from the past 72 hour(s))  CBC     Status: None   Collection Time: 07/26/16  9:50 AM  Result Value Ref Range   WBC 9.8 4.0 - 10.5 K/uL   RBC 4.79 3.87 - 5.11 MIL/uL   Hemoglobin 13.7 12.0 - 15.0 g/dL   HCT 21.340.5 08.636.0 - 57.846.0 %   MCV 84.6 78.0 - 100.0 fL   MCH 28.6 26.0 - 34.0 pg   MCHC 33.8 30.0 - 36.0 g/dL   RDW 46.913.9 62.911.5 - 52.815.5 %   Platelets 196 150 - 400 K/uL  Type and screen [facility]     Status: None (Preliminary result)   Collection Time: 07/26/16  9:50 AM  Result Value Ref Range   ABO/RH(D) O NEG    Antibody Screen PENDING    Sample Expiration 07/29/2016     Assessment and Plan :Erin RowerChristina A [name] is a 25 y.o. G2P0100 at 5929w1d being admitted for scheduled cesarean section. The risks of cesarean section discussed with the patient included but were not limited to: bleeding which may require transfusion or reoperation; infection which may require antibiotics; injury to bowel, bladder, ureters or other surrounding organs; injury to the fetus; need for additional procedures including hysterectomy in the event of a life-threatening hemorrhage; placental abnormalities wth subsequent pregnancies, incisional problems, thromboembolic phenomenon and other postoperative/anesthesia complications. The patient concurred with the proposed plan, giving informed written consent for the procedure. Patient has been NPO since last night she will remain NPO for procedure. Anesthesia and OR aware. Preoperative prophylactic antibiotics and SCDs ordered on call to the OR. To OR when ready.    Erin PennaNicholas Skyy Nilan MD OB Fellow  Faculty Practice, [facility] [place]

## 2016-07-26 NOTE — Op Note (Signed)
PROCEDURE DATE: 07/26/2016  PREOPERATIVE DIAGNOSIS:Elective repeat  POSTOPERATIVE DIAGNOSIS: The same  PROCEDURE:    Repeat Low Transverse Cesarean Section  SURGEON: Dr. Scheryl DarterJames Arnold MD                      Dr. Ernestina PennaNicholas Schenk MD ASSISTANT: none  INDICATIONS:  The risks of cesarean section discussed with the patient included but were not limited to: bleeding which may require transfusion or reoperation; infection which may require antibiotics; injury to bowel, bladder, ureters or other surrounding organs; injury to the fetus; need for additional procedures including hysterectomy in the event of a life-threatening hemorrhage; placental abnormalities wth subsequent pregnancies, incisional problems, thromboembolic phenomenon and other postoperative/anesthesia complications. The patient concurred with the proposed plan, giving informed written consent for the procedure.    FINDINGS:  Viable female infant in cephalic presentation.  Apgars pending, weight pending Clear amniotic fluid.  Intact placenta, three vessel cord.  Normal uterus, fallopian tubes and ovaries bilaterally.  ANESTHESIA:    Spinal INTRAVENOUS FLUIDS:2500 ml ESTIMATED BLOOD LOSS: 750ml URINE OUTPUT:  150 ml clear SPECIMENS:none COMPLICATIONS: None immediate   PROCEDURE IN DETAIL:  The patient received intravenous antibiotics and had sequential compression devices applied to her lower extremities while in the preoperative area.  She was then taken to the operating room where spinal anesthesia was administered (epidural anesthesia was dosed up to surgical level) and was found to be adequate. She was then placed in a dorsal supine position with a leftward tilt, and prepped and draped in a sterile manner.  A foley catheter was placed into her bladder and attached to constant gravity.  After an adequate timeout was performed, a Pfannenstiel skin incision was made with scalpel and carried through to the underlying layer of fascia with the  bovie. The fascia was incised in the midline and this incision was extended bilaterally using the Mayo scissors. Kocher clamps were applied to the superior aspect of the fascial incision and the underlying rectus muscles were dissected off bluntly. A similar process was carried out on the inferior aspect of the facial incision. The rectus muscles were separated in the midline bluntly and the peritoneum was entered bluntly. Attention was turned to the lower uterine segment where a bladder flap was created, and a transverse hysterotomy was made with a scalpel and extended bilaterally bluntly.  The infant was successfully delivered, nuchal cord x1 reduced and cord was clamped and cut and infant was handed over to awaiting neonatology team. Uterine massage was then administered and the placenta delivered intact with three-vessel cord. The uterus was cleared of clot and debris.  The hysterotomy was closed with 0 Vicryll in a running locked fashion, and an imbricating layer was also placed with a 0 Vicryl. Overall, excellent hemostasis was noted. The uterus was returned to the abdomen. Hemostasis was confirmed on all surfaces. The fascia was then closed using 0 Vicryl in a running locked fashion.  The subcutaneous space was closed with 0-chromic. A The skin was closed with 4-0 Vicryl. The patient tolerated the procedure well. Sponge, lap, instrument and needle counts were correct x 2. She was taken to the recovery room in stable condition.    Ernestina PennaNicholas Schenk MD [facility] fellow

## 2016-07-26 NOTE — Lactation Note (Signed)
This note was copied from a baby's chart. Lactation Consultation Note  Patient Name: Boy Cooper RenderChristina Meschke ZOXWR'UToday's Date: 07/26/2016 Reason for consult: Follow-up assessment Baby at 7 hr of life. Mom was trying to latch to the R breast in cradle position. Switched mom to cross cradle. She was having trouble compressing the breast because of the pulse Ox placement on her finger. Baby has a noticeable lingual frenulum, tight upper labial frenulum, and high palate. Baby can latch but needs help flanging lips and can only maintain short bursts of sucking. Applied #20 NS and baby was able to latch comfortably with long bursts of sucking. Discussed baby behavior, feeding frequency, post pumping, baby belly size, voids, wt loss, breast changes, and nipple care. Demonstrated manual expression, colostrum noted bilaterally, spoon in room. Mom is aware of lactation services and support group. She will call for help as needed.      Maternal Data    Feeding Feeding Type: Breast Fed Length of feed: 0 min  LATCH Score/Interventions Latch: Repeated attempts needed to sustain latch, nipple held in mouth throughout feeding, stimulation needed to elicit sucking reflex. Intervention(s): Adjust position;Assist with latch;Breast massage;Breast compression  Audible Swallowing: None Intervention(s): Skin to skin;Hand expression Intervention(s): Alternate breast massage  Type of Nipple: Everted at rest and after stimulation  Comfort (Breast/Nipple): Soft / non-tender     Hold (Positioning): Full assist, staff holds infant at breast Intervention(s): Support Pillows;Position options  LATCH Score: 5  Lactation Tools Discussed/Used Tools: Nipple Shields Nipple shield size: 20 Pump Review: Setup, frequency, and cleaning Initiated by:: ES Date initiated:: 07/26/16   Consult Status Consult Status: Follow-up Date: 07/27/16 Follow-up type: In-patient    Rulon Eisenmengerlizabeth E Nitika Jackowski 07/26/2016, 7:15  PM

## 2016-07-26 NOTE — Transfer of Care (Signed)
Immediate Anesthesia Transfer of Care Note  Patient: Erin [name]  Procedure(s) Performed: Procedure(s): CESAREAN SECTION (N/A)  Patient Location: PACU  Anesthesia Type:Spinal  Level of Consciousness: awake, alert  and oriented  Airway & Oxygen Therapy: Patient Spontanous Breathing  Post-op Assessment: Report given to RN and Post -op Vital signs reviewed and stable  Post vital signs: Reviewed and stable  Last Vitals:  Vitals:   07/26/16 1002  BP: 131/86  Pulse: 77  Resp: 16  Temp: 37 C    Last Pain:  Vitals:   07/26/16 1002  TempSrc: Oral      Patients Stated Pain Goal: 5 (07/26/16 1002)  Complications: No apparent anesthesia complications

## 2016-07-27 ENCOUNTER — Encounter (HOSPITAL_COMMUNITY): Payer: Self-pay | Admitting: Obstetrics & Gynecology

## 2016-07-27 ENCOUNTER — Encounter (HOSPITAL_COMMUNITY)
Admission: RE | Admit: 2016-07-27 | Discharge: 2016-07-27 | Disposition: A | Discharge status: Home / Self Care | Payer: 59 | Source: Ambulatory Visit

## 2016-07-27 DIAGNOSIS — Z98891 History of uterine scar from previous surgery: Secondary | ICD-10-CM

## 2016-07-27 LAB — CBC
HCT: 31.9 % — ABNORMAL LOW (ref 36.0–46.0)
HEMOGLOBIN: 10.7 g/dL — AB (ref 12.0–15.0)
MCH: 28.2 pg (ref 26.0–34.0)
MCHC: 33.5 g/dL (ref 30.0–36.0)
MCV: 83.9 fL (ref 78.0–100.0)
Platelets: 159 10*3/uL (ref 150–400)
RBC: 3.8 MIL/uL — ABNORMAL LOW (ref 3.87–5.11)
RDW: 14.3 % (ref 11.5–15.5)
WBC: 9.9 10*3/uL (ref 4.0–10.5)

## 2016-07-27 LAB — BIRTH TISSUE RECOVERY COLLECTION (PLACENTA DONATION)

## 2016-07-27 LAB — RPR: RPR Ser Ql: NONREACTIVE

## 2016-07-27 MED ORDER — SCOPOLAMINE 1 MG/3DAYS TD PT72: 1.0000 | MEDICATED_PATCH | Freq: Once | TRANSDERMAL | Status: DC

## 2016-07-27 MED ORDER — NALOXONE HCL 0.4 MG/ML IJ SOLN: 0.4000 mg | INTRAMUSCULAR | Status: DC | PRN

## 2016-07-27 MED ORDER — RHO D IMMUNE GLOBULIN 1500 UNIT/2ML IJ SOSY
300.0000 ug | PREFILLED_SYRINGE | Freq: Once | INTRAMUSCULAR | Status: AC
  Administered 2016-07-27: 300 ug via INTRAVENOUS
  Filled 2016-07-27: qty 2

## 2016-07-27 MED ORDER — ONDANSETRON HCL 4 MG/2ML IJ SOLN: 4.0000 mg | Freq: Three times a day (TID) | INTRAMUSCULAR | Status: DC | PRN

## 2016-07-27 MED ORDER — NALOXONE HCL 2 MG/2ML IJ SOSY: 1.0000 ug/kg/h | PREFILLED_SYRINGE | INTRAVENOUS | Status: DC | PRN

## 2016-07-27 MED ORDER — DIPHENHYDRAMINE HCL 25 MG PO CAPS: 25.0000 mg | ORAL_CAPSULE | ORAL | Status: DC | PRN

## 2016-07-27 MED ORDER — ACETAMINOPHEN 500 MG PO TABS
1000.0000 mg | ORAL_TABLET | Freq: Four times a day (QID) | ORAL | Status: DC
  Administered 2016-07-27 – 2016-07-28 (×3): 1000 mg via ORAL
  Filled 2016-07-27 (×3): qty 2

## 2016-07-27 MED ORDER — NALBUPHINE HCL 10 MG/ML IJ SOLN: 5.0000 mg | INTRAMUSCULAR | Status: DC | PRN

## 2016-07-27 MED ORDER — SODIUM CHLORIDE 0.9% FLUSH: 3.0000 mL | INTRAVENOUS | Status: DC | PRN

## 2016-07-27 MED ORDER — DIPHENHYDRAMINE HCL 50 MG/ML IJ SOLN: 12.5000 mg | INTRAMUSCULAR | Status: DC | PRN

## 2016-07-27 NOTE — Progress Notes (Signed)
POSTPARTUM PROGRESS NOTE  Post Op Day # 1 Subjective:  Erin [name] is a 25 y.o. G2P1100 5127w1d s/p rLTCS.  No acute events overnight.  Pt denies problems with ambulating, voiding or po intake.  She denies nausea or vomiting.  Pain is well controlled.  She has had flatus. She has not had bowel movement.  Lochia Small.   Objective: Blood pressure (!) 121/52, pulse 62, temperature 98.3 F (36.8 C), temperature source Oral, resp. rate 18, last menstrual period 10/06/2015, SpO2 97 %, unknown if currently breastfeeding.  Physical Exam:  General: alert, cooperative and no distress Lochia:normal flow Heart: RRR with intact distal pulses Abdomen: +BS, soft, appropriately tender. Honeycomb dressing with dried serosanguinous discharge that has not expanded beyond the pen markings. Uterine Fundus: firm DVT Evaluation: No calf swelling or tenderness   Recent Labs  07/26/16 0950 07/27/16 0548  HGB 13.7 10.7*  HCT 40.5 31.9*    Assessment/Plan:  ASSESSMENT: Erin [name] is a 25 y.o. G2P1100 6627w1d s/p rLTCS  Breastfeeding, Lactation consult, Circumcision prior to discharge and Contraception None Patient has [employer] and would like to know if the Circumcision can be done inpatient.  LOS: 1 day   Deniece ReeJosue D Santos, MD 07/27/2016, 7:23 AM   OB FELLOW POSTPARTUM PROGRESS NOTE ATTESTATION  I have seen and examined this patient and agree with above documentation in the resident's note.   Ernestina PennaNicholas Schenk, MD 7:55 AM

## 2016-07-27 NOTE — Progress Notes (Signed)
CSW acknowledged consult, and did not feel it as appropriate to discuss hx of loss at this time.  CSW spoke with bedside nurse, and no concerns were noted.   Please contact the Clinical Social Worker if needs arise, or if MOB requests.  Blaine HamperAngel Boyd-Gilyard, MSW, LCSW Clinical Social Work [phone]

## 2016-07-27 NOTE — Lactation Note (Signed)
This note was copied from a baby's chart. Lactation Consultation Note  Patient Name: Erin [name] Reason for consult: Follow-up assessment Baby at 32 hr of life. Upon entry baby was sleeping in mom's arms. Mom stated she can not get any milk from the R breast and does not think baby is getting enough. She asked if she could give bottles of formula. Discussed baby behavior, feeding frequency, baby belly size, voids, wt loss, breast changes, and nipple care. Suggested that mom supplement at the breast or use a cup or spoon to offer small amounts of formula while baby is learning to bf. Mom stated that her nipples are sore, but not cracked or bleeding and she does not want to latch baby. Offered latched help at the next feeding. Discussed the option of pumping, she declined any kind of pump at this time because she does not think she has any milk to pump. Tried to explain the pump as extra stimulation to the nipples but mom was not interested. Mom stated she might wait until her milk comes in then start bf again. Reviewed the risk of taking baby off the breast and artificial nipples. Mom will wait until the next feeding and "see how he does" before she decides to stop bf. She is nervious about going home over the weekend and having bf trouble but not knowing how to formula feed. Referred her to the Mom and Me book and the lactation hand outs. Reminded her of support group and OP services.    Maternal Data    Feeding Feeding Type: Breast Fed Length of feed: 15 min  LATCH Score/Interventions Latch: Repeated attempts needed to sustain latch, nipple held in mouth throughout feeding, stimulation needed to elicit sucking reflex. Intervention(s): Adjust position  Audible Swallowing: A few with stimulation Intervention(s): Skin to skin Intervention(s): Skin to skin  Type of Nipple: Everted at rest and after stimulation Intervention(s): Shells  Comfort  (Breast/Nipple): Soft / non-tender     Hold (Positioning): No assistance needed to correctly position infant at breast.  LATCH Score: 8  Lactation Tools Discussed/Used Tools: Nipple Shields Nipple shield size: 20   Consult Status Consult Status: Follow-up Date: 07/28/16 Follow-up type: In-patient    Erin [name] [name], 8:21 PM

## 2016-07-28 LAB — RH IG WORKUP (INCLUDES ABO/RH)
ABO/RH(D): O NEG
Fetal Screen: NEGATIVE
Gestational Age(Wks): 39.1
Unit division: 0

## 2016-07-28 MED ORDER — OXYCODONE HCL 5 MG PO TABS: 5.0000 mg | ORAL_TABLET | ORAL | 0 refills | Status: DC | PRN

## 2016-07-28 MED ORDER — IBUPROFEN 600 MG PO TABS
600.0000 mg | ORAL_TABLET | Freq: Four times a day (QID) | ORAL | 0 refills | Status: DC
Start: 2016-07-28 — End: 2016-07-31

## 2016-07-28 NOTE — Lactation Note (Signed)
This note was copied from a baby's chart. Lactation Consultation Note  Patient Name: Erin [name] RUEAV'WToday's Date: 07/28/2016 Reason for consult: Follow-up assessment;Infant weight loss (7% weight loss, use of NS to latch / supplementing ) Baby is 49 hours old, LC reviewed doc flow sheets, Nipple shield use since 11/9 , provided by LC . Breast feeding,  Consistently with NS, ( mom reports swallows and milk in the NS afterwards). Per mom started supplementing  During the night , baby seems hungry- 5 -15 ml x 4 after breast feeding. Latch score range 5-8 . 6 wets , 4 stools.  Bili check at 37 hours - 6.7.  Per mom left nipple sore . LC assessed both nipples with moms permission - right clear, short shaft nipple , when  Areola compressed nipple  Goes inward, indication for a NS use for latch. Left nipple appears clear, slightly pink,  Mom showed LC small intact absorbing blister like area. LC re - sized mom for NS size . On the right - #20 NS is the right fit for today .  On the Left - # 2O NS is the right fit for today. LC explained to mom is the after the baby if her nipple is pulled in to the NS towards the  Top to try the #24 NS . LC recommended post pump after 5- 6 feedings a day both breast , save milk and supplement by instilling EBM into the  NS as an appetizer prior to every feeding until the weight is increasing or [facility]. And plan on supplementing  After feeding until milk comes in well . Per mom mentioned baby has moments at the breast of sitting with the Nipple in the mouth. LC recommended if there are feedings in a 24 hr span where the baby is sluggish and being more non nutritive than nutritive to feed 1st breast  20 mins max and supplement , if still hungry latch on the other breast , and post pump for 10 -15 mins.  Sore nipple and engorgement prevention and tx reviewed. LC provided shells with instructions, and another #20 NS and #24 .  Per mom already has a hand pump And the #24 flange  is comfortable. Also per mom has DEBP Lansinoh pump at home.   Both mom and dad  [facility] at 1 pm. Appt. Reminder given to mom.  Mother informed of post-discharge support and given phone number to the lactation department, including services for phone call assistance; out-patient appointments; and breastfeeding support group. List of other breastfeeding resources in the community given in the handout. Encouraged mother to call for problems or concerns related to breastfeeding.    Maternal Data Has patient been taught Hand Expression?: Yes  Feeding Feeding Type:  (last fed around 11:30 )  LATCH Score/Interventions                Intervention(s): Breastfeeding basics reviewed     Lactation Tools Discussed/Used Tools: Shells;Pump;Nipple Dorris CarnesShields [facility](LC gave mom shells with instruction, already has hand pump ) Nipple shield size: 20;24;Other (comment) (LC re- sized mom and the #20 NS right fit for today , #24 NS may be needed before the LC O/P appt. ) Shell Type: Inverted Breast pump type: Manual   Consult Status Consult Status: Follow-up Date: 08/03/16 (@ 1PM , Appt. reminder given to mom ) Follow-up type: Out-patient    Erin CheMargaret  Silvio ClaymanAnn Zeeva [name] 07/28/2016, 2:05 PM

## 2016-07-28 NOTE — Discharge Summary (Signed)
OB Discharge Summary     Patient Name: Erin [name] DOB: [birth year] MRN: [id number]  Date of admission: 07/26/2016 Delivering MD: Adam PhenixARNOLD, JAMES G   Date of discharge: 07/28/2016  Admitting diagnosis: cpt [id number] - REPEAT c-section Intrauterine pregnancy: 6828w1d     Secondary diagnosis:  Active Problems:   S/P cesarean section  Additional problems:      Discharge diagnosis: Term Pregnancy Delivered                                                                                                Post partum procedures:  Augmentation: n/a  Complications: None  Hospital course:  Sceduled C/S   25 y.o. yo G2P1100 at 2528w1d was admitted to the hospital 07/26/2016 for scheduled cesarean section with the following indication:Elective Repeat.  Membrane Rupture Time/Date: 12:05 PM ,07/26/2016   Patient delivered a Viable infant.07/26/2016  Details of operation can be found in separate operative note.  Pateint had an uncomplicated postpartum course.  She is ambulating, tolerating a regular diet, passing flatus, and urinating well. Patient is discharged home in stable condition on  07/28/16          Physical exam Vitals:   07/27/16 0445 07/27/16 0839 07/27/16 1900 07/28/16 0600  BP: (!) 121/52 108/62 134/76 123/79  Pulse: 62 67 66 65  Resp: 18 16 18 18   Temp: 98.3 F (36.8 C) 97.9 F (36.6 C) 98 F (36.7 C) 98.4 F (36.9 C)  TempSrc: Oral Oral Oral Oral  SpO2:  100% 100%    General: alert, cooperative and no distress Lochia: appropriate Uterine Fundus: firm Incision: Healing well with no significant drainage, No significant erythema, Dressing is clean, dry, and intact DVT Evaluation: No evidence of DVT seen on physical exam. Labs: Lab Results  Component Value Date   WBC 9.9 07/27/2016   HGB 10.7 (L) 07/27/2016   HCT 31.9 (L) 07/27/2016   MCV 83.9 07/27/2016   PLT 159 07/27/2016   CMP Latest Ref Rng & Units 05/18/2016  Glucose 65 - 104 mg/dL 72    Discharge  instruction: per After Visit Summary and "Baby and Me Booklet".  After visit meds:    Medication List    STOP taking these medications   prenatal vitamin w/FE, FA 27-1 MG Tabs tablet     TAKE these medications   ibuprofen 600 MG tablet Commonly known as:  ADVIL,MOTRIN Take 1 tablet (600 mg total) by mouth every 6 (six) hours.   oxyCODONE 5 MG immediate release tablet Commonly known as:  Oxy IR/ROXICODONE Take 1 tablet (5 mg total) by mouth every 4 (four) hours as needed (pain scale 4-7).       Diet: routine diet  Activity: Advance as tolerated. Pelvic rest for 6 weeks.   Outpatient follow up:4 weeks Follow up Appt:Future Appointments Date Time Provider Department Center  08/29/2016 10:20 AM Marny LowensteinJulie N Wenzel, PA-C [facility]   Follow up Visit:No Follow-up on file.  Postpartum contraception: undecided  Newborn Data: Live born female  Birth Weight: 9 lb 4.7 oz (4215 g) APGAR: 8, 9  Baby Feeding: Breast  Disposition:home with mother   07/28/2016 Greig RightRESENZO-DISHMAN,Yatziri Wainwright, CNM

## 2016-07-28 NOTE — Discharge Instructions (Signed)
Cesarean Delivery, Care After  Refer to this sheet in the next few weeks. These instructions provide you with information on caring for yourself after your procedure. Your health care provider may also give you specific instructions. Your treatment has been planned according to current medical practices, but problems sometimes occur. Call your health care provider if you have any problems or questions after you go home.  HOME CARE INSTRUCTIONS   Only take over-the-counter or prescription medications as directed by your health care provider.   Do not drink alcohol, especially if you are breastfeeding or taking medication to relieve pain.   Do not chew or smoke tobacco.   Continue to use good perineal care. Good perineal care includes:    Wiping your perineum from front to back.    Keeping your perineum clean.   Check your surgical cut (incision) daily for increased redness, drainage, swelling, or separation of skin.   Clean your incision gently with soap and water every day, and then pat it dry. If your health care provider says it is okay, leave the incision uncovered. Use a bandage (dressing) if the incision is draining fluid or appears irritated. If the adhesive strips across the incision do not fall off within 7 days, carefully peel them off.   Hug a pillow when coughing or sneezing until your incision is healed. This helps to relieve pain.   Do not use tampons or douche until your health care provider says it is okay.   Shower, wash your hair, and take tub baths as directed by your health care provider.   Wear a well-fitting bra that provides breast support.   Limit wearing support panties or control-top hose.   Drink enough fluids to keep your urine clear or pale yellow.   Eat high-fiber foods such as whole grain cereals and breads, brown rice, beans, and fresh fruits and vegetables every day. These foods may help prevent or relieve constipation.   Resume activities such as climbing stairs,  driving, lifting, exercising, or traveling as directed by your health care provider.   Talk to your health care provider about resuming sexual activities. This is dependent upon your risk of infection, your rate of healing, and your comfort and desire to resume sexual activity.   Try to have someone help you with your household activities and your newborn for at least a few days after you leave the hospital.   Rest as much as possible. Try to rest or take a nap when your newborn is sleeping.   Increase your activities gradually.   Keep all of your scheduled postpartum appointments. It is very important to keep your scheduled follow-up appointments. At these appointments, your health care provider will be checking to make sure that you are healing physically and emotionally.  SEEK MEDICAL CARE IF:    You are passing large clots from your vagina. Save any clots to show your health care provider.   You have a foul smelling discharge from your vagina.   You have trouble urinating.   You are urinating frequently.   You have pain when you urinate.   You have a change in your bowel movements.   You have increasing redness, pain, or swelling near your incision.   You have pus draining from your incision.   Your incision is separating.   You have painful, hard, or reddened breasts.   You have a severe headache.   You have blurred vision or see spots.   You feel sad   or depressed.   You have thoughts of hurting yourself or your newborn.   You have questions about your care, the care of your newborn, or medications.   You are dizzy or light-headed.   You have a rash.   You have pain, redness, or swelling at the site of the removed intravenous access (IV) tube.   You have nausea or vomiting.   You stopped breastfeeding and have not had a menstrual period within 12 weeks of stopping.   You are not breastfeeding and have not had a menstrual period within 12 weeks of delivery.   You have a fever.  SEEK  IMMEDIATE MEDICAL CARE IF:   You have persistent pain.   You have chest pain.   You have shortness of breath.   You faint.   You have leg pain.   You have stomach pain.   Your vaginal bleeding saturates 2 or more sanitary pads in 1 hour.  MAKE SURE YOU:    Understand these instructions.   Will watch your condition.   Will get help right away if you are not doing well or get worse.     This information is not intended to replace advice given to you by your health care provider. Make sure you discuss any questions you have with your health care provider.     Document Released: 05/26/2002 Document Revised: 09/24/2014 Document Reviewed: 04/30/2012  Elsevier Interactive Patient Education 2016 Elsevier Inc.

## 2016-07-30 LAB — TYPE AND SCREEN
ABO/RH(D): O NEG
Antibody Screen: POSITIVE
DAT, IGG: NEGATIVE
UNIT DIVISION: 0
Unit division: 0

## 2016-07-31 ENCOUNTER — Inpatient Hospital Stay (HOSPITAL_COMMUNITY)
Admission: AD | Admit: 2016-07-31 | Discharge: 2016-07-31 | Disposition: A | Discharge status: Home / Self Care | Payer: Medicaid Other | Source: Ambulatory Visit | Attending: Anesthesiology | Admitting: Anesthesiology

## 2016-07-31 ENCOUNTER — Encounter (HOSPITAL_COMMUNITY): Payer: Self-pay

## 2016-07-31 ENCOUNTER — Ambulatory Visit (HOSPITAL_COMMUNITY): Payer: Medicaid Other | Admitting: Anesthesiology

## 2016-07-31 ENCOUNTER — Ambulatory Visit (HOSPITAL_COMMUNITY): Payer: 59

## 2016-07-31 ENCOUNTER — Inpatient Hospital Stay (EMERGENCY_DEPARTMENT_HOSPITAL)
Admission: AD | Admit: 2016-07-31 | Discharge: 2016-07-31 | Disposition: A | Discharge status: Home / Self Care | Payer: Medicaid Other | Source: Ambulatory Visit | Attending: Obstetrics and Gynecology | Admitting: Obstetrics and Gynecology

## 2016-07-31 DIAGNOSIS — O894 Spinal and epidural anesthesia-induced headache during the puerperium: Secondary | ICD-10-CM | POA: Insufficient documentation

## 2016-07-31 DIAGNOSIS — G971 Other reaction to spinal and lumbar puncture: Secondary | ICD-10-CM

## 2016-07-31 LAB — COMPREHENSIVE METABOLIC PANEL
ALBUMIN: 2.9 g/dL — AB (ref 3.5–5.0)
ALT: 33 U/L (ref 14–54)
ANION GAP: 6 (ref 5–15)
AST: 30 U/L (ref 15–41)
Alkaline Phosphatase: 130 U/L — ABNORMAL HIGH (ref 38–126)
BUN: 11 mg/dL (ref 6–20)
CHLORIDE: 106 mmol/L (ref 101–111)
CO2: 25 mmol/L (ref 22–32)
Calcium: 8.8 mg/dL — ABNORMAL LOW (ref 8.9–10.3)
Creatinine, Ser: 0.63 mg/dL (ref 0.44–1.00)
GFR calc Af Amer: >60 mL/min (ref >60)
GFR calc non Af Amer: >60 mL/min (ref >60)
GLUCOSE: 78 mg/dL (ref 65–99)
POTASSIUM: 4.2 mmol/L (ref 3.5–5.1)
SODIUM: 137 mmol/L (ref 135–145)
Total Bilirubin: 0.2 mg/dL — ABNORMAL LOW (ref 0.3–1.2)
Total Protein: 6.5 g/dL (ref 6.5–8.1)

## 2016-07-31 LAB — URINALYSIS, ROUTINE W REFLEX MICROSCOPIC
Bilirubin Urine: NEGATIVE
GLUCOSE, UA: NEGATIVE mg/dL
KETONES UR: NEGATIVE mg/dL
Nitrite: NEGATIVE
PH: 6.5 (ref 5.0–8.0)
PROTEIN: NEGATIVE mg/dL
Specific Gravity, Urine: 1.01 (ref 1.005–1.030)

## 2016-07-31 LAB — CBC
HEMATOCRIT: 35.9 % — AB (ref 36.0–46.0)
HEMOGLOBIN: 11.6 g/dL — AB (ref 12.0–15.0)
MCH: 27.8 pg (ref 26.0–34.0)
MCHC: 32.3 g/dL (ref 30.0–36.0)
MCV: 86.1 fL (ref 78.0–100.0)
Platelets: 242 10*3/uL (ref 150–400)
RBC: 4.17 MIL/uL (ref 3.87–5.11)
RDW: 14.1 % (ref 11.5–15.5)
WBC: 8.5 10*3/uL (ref 4.0–10.5)

## 2016-07-31 LAB — PROTEIN / CREATININE RATIO, URINE
Creatinine, Urine: 109 mg/dL
PROTEIN CREATININE RATIO: 0.1 mg/mg{creat} (ref 0.00–0.15)
Total Protein, Urine: 11 mg/dL

## 2016-07-31 LAB — URINE MICROSCOPIC-ADD ON
Bacteria, UA: NONE SEEN
RBC / HPF: NONE SEEN RBC/hpf (ref 0–5)

## 2016-07-31 MED ORDER — BUTALBITAL-APAP-CAFFEINE 50-325-40 MG PO TABS
2.0000 | ORAL_TABLET | Freq: Once | ORAL | Status: AC
  Administered 2016-07-31: 2 via ORAL
  Filled 2016-07-31: qty 2

## 2016-07-31 MED ORDER — CYCLOBENZAPRINE HCL 10 MG PO TABS
10.0000 mg | ORAL_TABLET | Freq: Three times a day (TID) | ORAL | Status: DC | PRN
  Administered 2016-07-31: 10 mg via ORAL
  Filled 2016-07-31: qty 1

## 2016-07-31 NOTE — MAU Note (Signed)
Had a c/s on Thur.  On the way home Sat, started getting a HA.  Became a full on migraine.  Ever since then she has had a HA off and on, worse when she is sitting or standing.  Gets dizzy, feels like she is going to pass out. Denies BP problems with preg., denies visual changes or  Swelling.

## 2016-07-31 NOTE — MAU Provider Note (Signed)
History     CSN: [id number]  Arrival date and time: 07/31/16 1150   None     Chief Complaint  Patient presents with  . Headache   HPI [name] s/p elective RCS on 07/26/2016 presenting today for the evaluation of a persistent migraine. Patient reports onset of her symptoms on day of discharge on 11/11. She states her headaches worsen over the course of the day, associated with nausea, emesis, and dizziness. Her headache is frontal. She denies visual disturbances. Her symptoms improve when she lays down and worsen when she sits up or performs daily activities. She denies any pregnancy complications involving gestational hypertension or preeclampsia.    History reviewed. No pertinent past medical history.  Past Surgical History:  Procedure Laterality Date  . CESAREAN SECTION    . CESAREAN SECTION N/A 07/26/2016   Procedure: CESAREAN SECTION;  Surgeon: Adam PhenixJames G Arnold, MD;  [facility];  Service: Obstetrics;  Laterality: N/A;  . egg donor x2       Family History  Problem Relation Age of Onset  . Cancer Mother   . Hypertension Father   . Cancer Father     Social History  Substance Use Topics  . Smoking status: Never Smoker  . Smokeless tobacco: Never Used  . Alcohol use No    Allergies:  Allergies  Allergen Reactions  . Sulfa Antibiotics Rash    Prescriptions Prior to Admission  Medication Sig Dispense Refill Last Dose  . ibuprofen (ADVIL,MOTRIN) 600 MG tablet Take 600 mg by mouth every 6 (six) hours as needed for mild pain or moderate pain.   07/31/2016 at 0700  . oxyCODONE (OXY IR/ROXICODONE) 5 MG immediate release tablet Take 5 mg by mouth every 4 (four) hours as needed (for pain scale 4-7).   Past Week at Unknown time  . Prenatal Vit-Fe Fumarate-FA (PRENATAL MULTIVITAMIN) TABS tablet Take 1 tablet by mouth daily.   07/31/2016 at Unknown time    ROS Physical Exam   Blood pressure 132/89, pulse (!) 53, temperature 98.7 F (37.1 C),  temperature source Oral, resp. rate 16, currently breastfeeding.  Physical Exam GENERAL: Well-developed, well-nourished female in no acute distress.  HEENT: Normocephalic, atraumatic. Sclerae anicteric.  NECK: Supple. Normal thyroid.  LUNGS: Clear to auscultation bilaterally.  HEART: Regular rate and rhythm. ABDOMEN: Soft, nontender, nondistended.  Incision: honeycomb dressing intact PELVIC: Not performed EXTREMITIES: No cyanosis, clubbing, or edema, 2+ distal pulses.  CBC Latest Ref Rng & Units 07/31/2016 07/27/2016 07/26/2016  WBC 4.0 - 10.5 K/uL 8.5 9.9 9.8  Hemoglobin 12.0 - 15.0 g/dL 11.6(L) 10.7(L) 13.7  Hematocrit 36.0 - 46.0 % 35.9(L) 31.9(L) 40.5  Platelets 150 - 400 K/uL 242 159 196   CMP     Component Value Date/Time   NA 137 07/31/2016 1244   K 4.2 07/31/2016 1244   CL 106 07/31/2016 1244   CO2 25 07/31/2016 1244   GLUCOSE 78 07/31/2016 1244   GLUCOSE 72 05/18/2016 1149   BUN 11 07/31/2016 1244   CREATININE 0.63 07/31/2016 1244   CALCIUM 8.8 (L) 07/31/2016 1244   PROT 6.5 07/31/2016 1244   ALBUMIN 2.9 (L) 07/31/2016 1244   AST 30 07/31/2016 1244   ALT 33 07/31/2016 1244   ALKPHOS 130 (H) 07/31/2016 1244   BILITOT 0.2 (L) 07/31/2016 1244   GFRNONAA >60 07/31/2016 1244   GFRAA >60 07/31/2016 1244    MAU Course  Procedures  MDM CBC CMP Flexeril/Fioricet Anesthesia consult for likely  spinal headache Assessment and Plan  25 yo with spinal headache - Patient ruled out for preeclampsia - Dr. Jean RosenthalJackson (anesthesiologist) saw patient with plans to perform a blood patch - Patient to follow on 08/29/16 as scheduled for routine postpartum visit  Erin [name] 07/31/2016, 1:46 PM

## 2016-07-31 NOTE — Addendum Note (Signed)
Addendum  created 07/31/16 1249 by Cristela BlueKyle Maame Dack, MD   Anesthesia Intra Blocks edited, Child order released for a procedure order, Delete clinical note, Order Canceled from Note, Sign clinical note

## 2016-07-31 NOTE — Anesthesia Procedure Notes (Addendum)
Spinal  Patient location during procedure: OR Start time: 07/26/2016 11:40 AM Preanesthetic Checklist Completed: patient identified, site marked, surgical consent, pre-op evaluation, timeout performed, IV checked, risks and benefits discussed and monitors and equipment checked Spinal Block Patient position: sitting Prep: DuraPrep Patient monitoring: heart rate, cardiac monitor, continuous pulse ox and blood pressure Approach: midline Location: L3-4 Injection technique: single-shot Needle Needle type: Sprotte  Needle gauge: 24 G Needle length: 9 cm Assessment Sensory level: T4 Additional Notes Spinal Dosage in OR  Bupivicaine ml       1.8 PFMS04   mcg        100 Fentanyl mcg            25

## 2016-07-31 NOTE — Progress Notes (Signed)
07/31/16   1500 Patient arrived per wheelchair. Changed into patient gown and put to bed with head of bed 45degrees. Waiting for paper work. Husband in surgical waiting area. Matilde BashN Deanza Upperman, RN

## 2016-07-31 NOTE — MAU Note (Signed)
Urine in lab 

## 2016-07-31 NOTE — Anesthesia Procedure Notes (Deleted)
Spinal

## 2016-07-31 NOTE — Anesthesia Procedure Notes (Signed)
Epidural Blood Patch Patient location during procedure: holding area Start time: 07/31/2016 3:20 PM End time: 07/31/2016 3:35 PM  Staffing Anesthesiologist: Cristela BlueJACKSON, Erin Hoopes  Preanesthetic Checklist Completed: patient identified, site marked, surgical consent, pre-op evaluation, timeout performed, IV checked, risks and benefits discussed and monitors and equipment checked  Epidural Patient position: sitting Prep: site prepped and draped and DuraPrep Patient monitoring: continuous pulse ox Approach: midline Location: L3-L4 Injection technique: LOR air  Needle:  Needle type: Tuohy  Needle gauge: 17 G Needle length: 9 cm and 9 Needle insertion depth: 6 cm Catheter type: closed end flexible Catheter size: 19 Gauge Catheter at skin depth: 10 cm Test dose: negative  Assessment Events: blood not aspirated, injection not painful, no injection resistance, negative IV test and no paresthesia  Additional Notes 20 cc Autolgous blood from sterile prepped and draped R AC Fossa Injected without Sx of BA or cramping

## 2016-07-31 NOTE — Progress Notes (Signed)
07/31/16 1528 epidural Blood patch started per Dr Cristela BlueKyle Jackson anesthesia.  Pain level of headache at a 6 . Essie ChristineNKazmar, RCharity fundraiser

## 2016-07-31 NOTE — Progress Notes (Signed)
07/31/16  1615 Patient states no headache.  And has been sleeping.Matilde BashN Phung Kotas, RN

## 2016-07-31 NOTE — Discharge Instructions (Signed)
Spinal Headache A spinal headache is a severe headache that can happen after a person has had a lumbar puncture or an epidural anesthetic medicine. A needle is passed between the bones of the spine during these procedures. The headache usually starts hours or 1-2 days after that. The headache can last a for few days. In rare cases, it lasts for more than 1 week. What are the causes? This condition is caused by a leak of spinal fluid from the spine through the hole that is left by the needle. What are the signs or symptoms? Symptoms of this condition include:  A severe headache.  A headache that is worse when you sit or stand and better when you lie down.  Neck pain and stiffness, especially when tilting your chin toward your chest (flexing your neck).  Nausea and vomiting. How is this diagnosed? This condition is usually diagnosed based on:  Medical history, especially if you recently had a lumbar puncture or an epidural anesthetic.  Pain relief when lying flat. This supports the diagnosis of a spinal headache.  MRI or other tests. On rare occasions, this may be done to confirm the diagnosis. How is this treated? Treatment for this condition may include:  Drinking more fluids or getting fluids through an IV tube that is inserted into one of your veins. This will help your body replace the spinal fluid that has leaked out through the needle hole.  Drinking caffeinated beverages such as soda, coffee, or tea. Caffeine may help to shrink the blood vessels in your brain for a while, which may reduce your headache.  Having an epidural blood patch procedure. The procedure involves injecting a small amount of your blood in the place where the leak is to seal the leak.  Taking pain medicine.  Lying flat for a few days. Follow these instructions at home:  Drink enough fluids to keep your urine clear or pale yellow.  Drink caffeinated beverages as told by your health care provider.  Take  over-the-counter and prescription medicines only as told by your health care provider.  Lie down to relieve pain if your pain gets worse when you sit or stand. Get help right away if:  Your pain becomes very severe.  Your pain cannot be controlled.  You develop a fever.  You have a stiff neck.  You lose control of your bowel or bladder (have incontinence).  You have trouble walking, you feel weak, or you lose feeling in part of your body. This information is not intended to replace advice given to you by your health care provider. Make sure you discuss any questions you have with your health care provider. Document Released: 02/23/2002 Document Revised: 06/15/2016 Document Reviewed: 06/15/2016 Elsevier Interactive Patient Education  2017 Elsevier Inc.    Epidural Blood Patch for Spinal Headache, Care After Refer to this sheet in the next few weeks. These instructions provide you with information about caring for yourself after your procedure. Your health care provider may also give you more specific instructions. Your treatment has been planned according to current medical practices, but problems sometimes occur. Call your health care provider if you have any problems or questions after your procedure. What can I expect after the procedure? After the procedure, it is common to have:  Almost-instant relief from your headache, or have slowly easing pain over the next 24 hours.  Mild backaches. These may last for a few days.  A mild feeling of prickly or tingly skin.  Neck  pain.  Pain down the arm or leg (radiating pain). Follow these instructions at home:  For the first 24 hours after the procedure:  Do not drive if you received a medicine to help you relax (sedative).  Rest in bed as much as you can.  For 2-3 weeks, do not lift anything that is heavier than 10 lb (4.5 kg).  When picking up items from a low position, squat rather than bend.  Avoid too much straining.  This can cause the patch to move out of position and cause the headache to return.  Drink enough fluids to keep your urine clear or pale yellow.  Take over-the-counter and prescription medicines only as told by your health care provider.  Keep all follow-up visits as told by your health care provider. This is important. Contact a health care provider if:  You have a fever.  You have back pain.  You have pain that goes down your legs.  Your headache comes back.  You have trouble with your bowels or bladder. Get help right away if:  You have a very bad headache.  You have nausea or vomiting. This information is not intended to replace advice given to you by your health care provider. Make sure you discuss any questions you have with your health care provider. Document Released: 06/24/2013 Document Revised: 02/09/2016 Document Reviewed: 12/01/2015 Elsevier Interactive Patient Education  2017 [employer]

## 2016-07-31 NOTE — Progress Notes (Signed)
07/31/16 1630 home per wheelchair.No headache. Given discharge instructions for home. Matilde BashN Jessicca Stitzer, RN

## 2016-07-31 NOTE — Anesthesia Postprocedure Evaluation (Signed)
Anesthesia Post Note  Patient: Erin [name]  Procedure(s) Performed: * No procedures listed *  Patient location during evaluation: PACU Anesthesia Type: Epidural Level of consciousness: awake Pain management: pain level controlled Vital Signs Assessment: post-procedure vital signs reviewed and stable Respiratory status: spontaneous breathing Postop Assessment: no headache Anesthetic complications: no     Last Vitals:  Vitals:   07/31/16 1258 07/31/16 1356  BP: 132/89 132/88  Pulse: (!) 53 62  Resp: 16   Temp:      Last Pain:  Vitals:   07/31/16 1400  TempSrc:   PainSc: 4    Pain Goal:                 Jiles GarterJACKSON,Marveline Profeta EDWARD

## 2016-07-31 NOTE — Progress Notes (Signed)
Pt seen in MAU. S/P CS on Thurs 11-9 with Spinal anesthesia.  She reports a HA that first came to bother her on the way home from the hospital on Sat. Since that time it has waxed and waned in severity; often relieved by laying back down and napping, only to return with resumed activity out of bed. No bowel or bladder sx noted. No numbness or weakness. Visual changes are not significant.   PE is unremarkable. No sinus pain. Back pain negligible and site well healed and not significantly swollen/red.  A/P.... spinal headache  Offered conservative Rx and EBP. She opts for EBP. Risks of Epidural Blood patch discussed including same risks as spinal anesthesia, as well as increased HA or BA from EBP

## 2016-07-31 NOTE — Anesthesia Procedure Notes (Signed)
Procedures

## 2016-08-01 ENCOUNTER — Encounter: Payer: Medicaid Other | Admitting: Obstetrics & Gynecology

## 2016-08-03 ENCOUNTER — Ambulatory Visit (HOSPITAL_COMMUNITY)
Admission: RE | Admit: 2016-08-03 | Payer: Medicaid Other | Source: Ambulatory Visit | Admitting: Obstetrics & Gynecology

## 2016-08-29 ENCOUNTER — Encounter: Payer: Self-pay | Admitting: Medical

## 2016-08-29 ENCOUNTER — Ambulatory Visit (INDEPENDENT_AMBULATORY_CARE_PROVIDER_SITE_OTHER): Payer: 59 | Admitting: Medical

## 2016-08-29 DIAGNOSIS — Z98891 History of uterine scar from previous surgery: Secondary | ICD-10-CM

## 2016-08-29 DIAGNOSIS — Z8489 Family history of other specified conditions: Secondary | ICD-10-CM | POA: Insufficient documentation

## 2016-08-29 NOTE — Patient Instructions (Addendum)
Contraception Choices Birth control (contraception) is the use of any methods or devices to stop pregnancy from happening. Below are some methods to help avoid pregnancy. Hormonal birth control  A small tube put under the skin of the upper arm (implant). The tube can stay in place for 3 years. The implant must be taken out after 3 years.  Shots given every 3 months.  Pills taken every day.  Patches that are changed once a week.  A ring put into the vagina (vaginal ring). The ring is left in place for 3 weeks and removed for 1 week. Then, a new ring is put in the vagina.  Emergency birth control pills taken after unprotected sex (intercourse). Barrier birth control  A thin covering worn on the penis (female condom) during sex.  A soft, loose covering put into the vagina (female condom) before sex.  A rubber bowl that sits over the cervix (diaphragm). The bowl must be made for you. The bowl is put into the vagina before sex. The bowl is left in place for 6 to 8 hours after sex.  A small, soft cup that fits over the cervix (cervical cap). The cup must be made for you. The cup can be left in place for 48 hours after sex.  A sponge that is put into the vagina before sex.  A chemical that kills or stops sperm from getting into the cervix and uterus (spermicide). The chemical may be a cream, jelly, foam, or pill. Intrauterine (IUD) birth control  IUD birth control is a small, T-shaped piece of plastic. The plastic is put inside the uterus. There are 2 types of IUD:  Copper IUD. The IUD is covered in copper wire. The copper makes a fluid that kills sperm. It can stay in place for 10 years.  Hormone IUD. The hormone stops pregnancy from happening. It can stay in place for 5 years. Permanent methods  When the woman has her fallopian tubes sealed, tied, or blocked during surgery. This stops the egg from traveling to the uterus.  The doctor places a small coil or insert into each fallopian  tube. This causes scar tissue to form and blocks the fallopian tubes.  When the female has the tubes that carry sperm tied off (vasectomy). Natural family planning birth control  Natural family planning means not having sex or using barrier birth control on the days the woman could become pregnant.  Use a calendar to keep track of the length of each period and know the days she can get pregnant.  Avoid sex during ovulation.  Use a thermometer to measure body temperature. Also watch for symptoms of ovulation.  Time sex to be after the woman has ovulated. Use condoms to help protect yourself against sexually transmitted infections (STIs). Do this no matter what type of birth control you use. Talk to your doctor about which type of birth control is best for you. This information is not intended to replace advice given to you by your health care provider. Make sure you discuss any questions you have with your health care provider. Document Released: 07/01/2009 Document Revised: 02/09/2016 Document Reviewed: 03/25/2013 Elsevier Interactive Patient Education  2017 Elsevier Inc.  Human Papillomavirus Human papillomavirus (HPV) is the most common sexually transmitted infection (STI). It is easy to pass it from person to person (contagious). HPV can cause cervical cancer, anal cancer, and genital warts. The genital warts can be seen and felt. Also, there may be wartlike regions in the throat.  HPV may not have any symptoms. It is possible to have HPV for a long time and not know it. You may pass HPV on to others without knowing it. Follow these instructions at home:  Take medicines as told by your doctor.  Use over-the-counter creams for itching as told by your doctor.  Keep all follow-up visits. Make sure to get Pap tests as told by your doctor.  Do not touch or scratch the warts.  Do not treat genital warts with medicines used for treating hand warts.  Do not have sex while you are getting  treatment.  Do not douche or use tampons during treatment of HPV.  Tell your sex partner about your infection because he or she may also need treatment.  If you get pregnant, tell your doctor that you had HPV. Your doctor will watch your pregnancy closely. This is important to keep your baby safe.  After treatment, use condoms during sex to prevent future infections.  Have only one sex partner.  Have a sex partner who does not have other sex partners. Contact a doctor if:  The treated skin is red, swollen, or painful.  You have a fever.  You feel ill.  You feel lumps or pimple-like areas in and around your genital area.  You have bleeding of the vagina or the area that was treated.  You have pain during sex. This information is not intended to replace advice given to you by your health care provider. Make sure you discuss any questions you have with your health care provider. Document Released: 08/16/2008 Document Revised: 02/09/2016 Document Reviewed: 12/09/2013 Elsevier Interactive Patient Education  2017 [employer]

## 2016-08-29 NOTE — Progress Notes (Signed)
Subjective:     Erin RowerChristina A [name] is a 25 y.o. female who presents for a postpartum visit. She is 5 weeks postpartum following a low cervical transverse Cesarean section. I have fully reviewed the prenatal and intrapartum course. The delivery was at 39 gestational weeks. Outcome: repeat cesarean section, low transverse incision. Anesthesia: spinal. Postpartum course has been unremarkable. Baby's course has been unremarkable. Baby is feeding by both breast and bottle - Similac Advance. Bleeding no bleeding. Bowel function is normal. Bladder function is normal. Patient is not sexually active. Contraception method is none. Postpartum depression screening: negative.  The following portions of the patient's history were reviewed and updated as appropriate: allergies, current medications, past family history, past medical history, past social history, past surgical history and problem list.  Review of Systems Pertinent items are noted in HPI.   Objective:    BP 116/77   Pulse 73   Wt 200 lb 11.2 oz (91 kg)   BMI 30.52 kg/m   General:  alert and cooperative   Breasts:  not performed  Lungs: clear to auscultation bilaterally  Heart:  regular rate and rhythm, S1, S2 normal, no murmur, click, rub or gallop  Abdomen: soft, non-tender, incision is healed   Vulva:  not evaluated  Vagina: not evaluated  Cervix:  not evaluated  Corpus: not examined  Adnexa:  not evaluated  Rectal Exam: Not performed.        Assessment:     Normal postpartum exam. Pap smear not done at today's visit.  Patient had abnormal results 12/2015 and needs repeat pap with cotesting 12/2016.   Plan:    1. Contraception: abstinence 2. Follow up in: April 2018 for pap smear with cotesting or as needed.    Marny LowensteinJulie N Wenzel, PA-C  08/29/2016 11:04 AM

## 2016-09-24 ENCOUNTER — Telehealth: Payer: Self-pay | Admitting: *Deleted

## 2016-09-24 MED ORDER — NORGESTIMATE-ETH ESTRADIOL 0.25-35 MG-MCG PO TABS: 1.0000 | ORAL_TABLET | Freq: Every day | ORAL | 11 refills | Status: DC

## 2016-09-24 NOTE — Telephone Encounter (Addendum)
Pt left message on 1/3 stating that she previously had not wanted any birth control however she has had her first period after delivery of her baby and has changed her mind. She would like Rx for OCP's sent to her pharmacy. Per chart review, pt had PP visit with Vonzella NippleJulie Wenzel, PA on 08/29/16. [facility] for review and Rx if appropriate. Pt was contacted and informed that I will call her back once the prescription has been authorized. Pt stated that she is no longer breastfeeding.

## 2016-09-24 NOTE — Telephone Encounter (Signed)
Patient can have Rx for Sprintec as long as she has not had unprotected intercourse in the last 2 weeks.

## 2016-09-24 NOTE — Telephone Encounter (Signed)
Called pt re: Rx for OCP has been approved.  She states that she has had unprotected sex within the last 2 weeks.  Pt was advised that Rx for OCP's will be sent to her pharmacy however she may not begin them until she receives her next period. She may begin taking the OCP's on the Sunday following the first Erin [name] of her next period. Meanwhile she should use condoms with each episode of intercourse and continue condoms until 2 full weeks of pills have been taken. Pt voiced understanding of instruction and information given.

## 2016-12-07 ENCOUNTER — Ambulatory Visit (INDEPENDENT_AMBULATORY_CARE_PROVIDER_SITE_OTHER): Payer: Commercial Managed Care - PPO | Admitting: Obstetrics & Gynecology

## 2016-12-07 ENCOUNTER — Encounter: Payer: Self-pay | Admitting: Obstetrics & Gynecology

## 2016-12-07 VITALS — BP 136/79 | HR 67 | Wt 198.7 lb

## 2016-12-07 DIAGNOSIS — R87610 Atypical squamous cells of undetermined significance on cytologic smear of cervix (ASC-US): Secondary | ICD-10-CM

## 2016-12-07 DIAGNOSIS — Z1151 Encounter for screening for human papillomavirus (HPV): Secondary | ICD-10-CM | POA: Diagnosis not present

## 2016-12-07 DIAGNOSIS — Z309 Encounter for contraceptive management, unspecified: Secondary | ICD-10-CM

## 2016-12-07 DIAGNOSIS — Z124 Encounter for screening for malignant neoplasm of cervix: Secondary | ICD-10-CM

## 2016-12-07 NOTE — Progress Notes (Signed)
History:  26 y.o. [id number] here today for f/u of abnormal PAP. She also wants to discuss further contraception options. She is currently on OCPs but, she reports that she has been missing pills.  Her menses are getting heavier and more painful.    The following portions of the patient's history were reviewed and updated as appropriate: allergies, current medications, past family history, past medical history, past social history, past surgical history and problem list.  Review of Systems:  Pertinent items are noted in HPI.   Objective:  Physical Exam Blood pressure 136/79, pulse 67, weight 198 lb 11.2 oz (90.1 kg), currently breastfeeding. BP 136/79   Pulse 67   Wt 198 lb 11.2 oz (90.1 kg)   BMI 30.21 kg/m  CONSTITUTIONAL: Well-developed, well-nourished female in no acute distress.  HENT:  Normocephalic, atraumatic EYES: Conjunctivae and EOM are normal. No scleral icterus.  NECK: Normal range of motion SKIN: Skin is warm and dry. No rash noted. Not diaphoretic.No pallor. NEUROLGIC: Alert and oriented to person, place, and time. Normal coordination.  Abd: Soft, nontender and nondistended Pelvic: Normal appearing external genitalia; normal appearing vaginal mucosa and cervix.  Normal discharge.   Labs and Imaging 12/19/2015 ATYPICAL SQUAMOUS CELLS OF UNDETERMINED SIGNIFICANCE (ASC-US). Valinda HoarJULIA MANNY MD Pathologist, Electronic Signature (Case signed 12/22/2015) Source CervicoVaginal Pap [ThinPrep Imaged] Molecular Results Test Result Chlamydia T. Negative HPV High Risk DETECTED Neisseria G. Negative  Assessment & Plan:  Abnormal PAP: repeat PAP done.   f/u PAP with HPV  Pt will f/[facility] in [facility] at [facility] for [facility] of an IUD.  July Linam L. Harraway-Smith, M.D., Evern CoreFACOG

## 2016-12-10 ENCOUNTER — Encounter: Payer: Self-pay | Admitting: Obstetrics & Gynecology

## 2016-12-10 ENCOUNTER — Ambulatory Visit (INDEPENDENT_AMBULATORY_CARE_PROVIDER_SITE_OTHER): Payer: Commercial Managed Care - PPO | Admitting: Obstetrics & Gynecology

## 2016-12-10 VITALS — BP 117/72 | HR 67 | Ht 68.0 in | Wt 200.0 lb

## 2016-12-10 DIAGNOSIS — Z30430 Encounter for insertion of intrauterine contraceptive device: Secondary | ICD-10-CM | POA: Diagnosis not present

## 2016-12-10 NOTE — Progress Notes (Signed)
GYNECOLOGY CLINIC PROCEDURE NOTE  Erin RowerChristina A [name] is a 26 y.o. G2P1101 here for Mirena IUD insertion. No GYN concerns.  Last pap smear was on 12/07/2016- the results are pending.  Her prev PAP was 12/19/2015 and showed ASCUS with +hrHPV  IUD Insertion Procedure Note Patient identified, informed consent performed.  Discussed risks of irregular bleeding, cramping, infection, malpositioning or misplacement of the IUD outside the uterus which may require further procedures. Time out was performed.  Urine pregnancy test negative.  Speculum placed in the vagina.  Cervix visualized.  Cleaned with Betadine x 2.  Grasped anteriorly with a single tooth tenaculum.  Uterus sounded to 9 cm.  Mirena IUD placed per manufacturer's recommendations.  Strings trimmed to 3 cm. Tenaculum was removed, good hemostasis noted.  Patient tolerated procedure well.   Patient was given post-procedure instructions.  Patient was asked to follow up in 4 weeks for IUD check.  Robynne Roat L. Harraway-Smith, M.D., Evern CoreFACOG

## 2016-12-10 NOTE — Patient Instructions (Addendum)
Levonorgestrel intrauterine device (IUD) What is this medicine? LEVONORGESTREL IUD (LEE voe nor jes trel) is a contraceptive (birth control) device. The device is placed inside the uterus by a healthcare professional. It is used to prevent pregnancy. This device can also be used to treat heavy bleeding that occurs during your period. This medicine may be used for other purposes; ask your health care provider or pharmacist if you have questions. COMMON BRAND NAME(S): Kyleena, LILETTA, Mirena, Skyla What should I tell my health care provider before I take this medicine? They need to know if you have any of these conditions: -abnormal Pap smear -cancer of the breast, uterus, or cervix -diabetes -endometritis -genital or pelvic infection now or in the past -have more than one sexual partner or your partner has more than one partner -heart disease -history of an ectopic or tubal pregnancy -immune system problems -IUD in place -liver disease or tumor -problems with blood clots or take blood-thinners -seizures -use intravenous drugs -uterus of unusual shape -vaginal bleeding that has not been explained -an unusual or allergic reaction to levonorgestrel, other hormones, silicone, or polyethylene, medicines, foods, dyes, or preservatives -pregnant or trying to get pregnant -breast-feeding How should I use this medicine? This device is placed inside the uterus by a health care professional. Talk to your pediatrician regarding the use of this medicine in children. Special care may be needed. Overdosage: If you think you have taken too much of this medicine contact a poison control center or emergency room at once. NOTE: This medicine is only for you. Do not share this medicine with others. What if I miss a dose? This does not apply. Depending on the brand of device you have inserted, the device will need to be replaced every 3 to 5 years if you wish to continue using this type of birth  control. What may interact with this medicine? Do not take this medicine with any of the following medications: -amprenavir -bosentan -fosamprenavir This medicine may also interact with the following medications: -aprepitant -armodafinil -barbiturate medicines for inducing sleep or treating seizures -bexarotene -boceprevir -griseofulvin -medicines to treat seizures like carbamazepine, ethotoin, felbamate, oxcarbazepine, phenytoin, topiramate -modafinil -pioglitazone -rifabutin -rifampin -rifapentine -some medicines to treat HIV infection like atazanavir, efavirenz, indinavir, lopinavir, nelfinavir, tipranavir, ritonavir -St. John's wort -warfarin This list may not describe all possible interactions. Give your health care provider a list of all the medicines, herbs, non-prescription drugs, or dietary supplements you use. Also tell them if you smoke, drink alcohol, or use illegal drugs. Some items may interact with your medicine. What should I watch for while using this medicine? Visit your doctor or health care professional for regular check ups. See your doctor if you or your partner has sexual contact with others, becomes HIV positive, or gets a sexual transmitted disease. This product does not protect you against HIV infection (AIDS) or other sexually transmitted diseases. You can check the placement of the IUD yourself by reaching up to the top of your vagina with clean fingers to feel the threads. Do not pull on the threads. It is a good habit to check placement after each menstrual period. Call your doctor right away if you feel more of the IUD than just the threads or if you cannot feel the threads at all. The IUD may come out by itself. You may become pregnant if the device comes out. If you notice that the IUD has come out use a backup birth control method like condoms and call your   health care provider. Using tampons will not change the position of the IUD and are okay to use  during your period. This IUD can be safely scanned with magnetic resonance imaging (MRI) only under specific conditions. Before you have an MRI, tell your healthcare provider that you have an IUD in place, and which type of IUD you have in place. What side effects may I notice from receiving this medicine? Side effects that you should report to your doctor or health care professional as soon as possible: -allergic reactions like skin rash, itching or hives, swelling of the face, lips, or tongue -fever, flu-like symptoms -genital sores -high blood pressure -no menstrual period for 6 weeks during use -pain, swelling, warmth in the leg -pelvic pain or tenderness -severe or sudden headache -signs of pregnancy -stomach cramping -sudden shortness of breath -trouble with balance, talking, or walking -unusual vaginal bleeding, discharge -yellowing of the eyes or skin Side effects that usually do not require medical attention (report to your doctor or health care professional if they continue or are bothersome): -acne -breast pain -change in sex drive or performance -changes in weight -cramping, dizziness, or faintness while the device is being inserted -headache -irregular menstrual bleeding within first 3 to 6 months of use -nausea This list may not describe all possible side effects. Call your doctor for medical advice about side effects. You may report side effects to FDA at 1-800-FDA-1088. Where should I keep my medicine? This does not apply. NOTE: This sheet is a summary. It may not cover all possible information. If you have questions about this medicine, talk to your doctor, pharmacist, or health care provider.  2018 Elsevier/Gold Standard (2016-06-15 14:14:56) IUD PLACEMENT POST-PROCEDURE INSTRUCTIONS  1. You may take Ibuprofen, Aleve or Tylenol for pain if needed.  Cramping should resolve within in 24 hours.  2. You may have a small amount of spotting.  You should wear a mini  pad for the next few days.  3. You may have intercourse after 24 hours.  If you using this for birth control, it is effective immediately.  4. You need to call if you have any pelvic pain, fever, heavy bleeding or foul smelling vaginal discharge.  Irregular bleeding is common the first several months after having an IUD placed. You do not need to call for this reason unless you are concerned.  5. Shower or bathe as normal  6. You should have a follow-up appointment in 4-8 weeks for a re-check to make sure you are not having any problems. 

## 2016-12-11 LAB — CYTOLOGY - PAP: Diagnosis: NEGATIVE

## 2016-12-25 ENCOUNTER — Ambulatory Visit: Payer: 59 | Admitting: Medical

## 2017-01-07 ENCOUNTER — Encounter: Payer: Self-pay | Admitting: Obstetrics & Gynecology

## 2017-01-07 ENCOUNTER — Ambulatory Visit (INDEPENDENT_AMBULATORY_CARE_PROVIDER_SITE_OTHER): Payer: Commercial Managed Care - PPO | Admitting: Obstetrics & Gynecology

## 2017-01-07 VITALS — BP 124/63 | HR 68 | Ht 68.0 in | Wt 199.0 lb

## 2017-01-07 DIAGNOSIS — Z30431 Encounter for routine checking of intrauterine contraceptive device: Secondary | ICD-10-CM

## 2017-01-07 NOTE — Progress Notes (Signed)
   Subjective:    Patient ID: Erin [name], female    DOB: 05/04/1991, 26 y.o.   MRN: [id number]  HPI 26 yo P1 here for a string check. She had Mirena placed about 4 weeks ago. She was on her period when the IUD was placed, hasn't bled since that period. No complaints.   Review of Systems Pap 3/18 normal [facility] at [facility]    Objective:   Physical Exam [id number] Breathing, conversing, and ambulating normally IUD strings seen       Assessment & Plan:  Contraception- happy with Mirena RTC 1 year/prn sooner

## 2017-02-18 ENCOUNTER — Encounter: Payer: Self-pay | Admitting: Obstetrics and Gynecology

## 2017-02-18 ENCOUNTER — Ambulatory Visit (INDEPENDENT_AMBULATORY_CARE_PROVIDER_SITE_OTHER): Payer: Commercial Managed Care - PPO | Admitting: Obstetrics and Gynecology

## 2017-02-18 DIAGNOSIS — R42 Dizziness and giddiness: Secondary | ICD-10-CM | POA: Insufficient documentation

## 2017-02-18 DIAGNOSIS — R102 Pelvic and perineal pain: Secondary | ICD-10-CM | POA: Diagnosis not present

## 2017-02-18 DIAGNOSIS — Z975 Presence of (intrauterine) contraceptive device: Secondary | ICD-10-CM | POA: Insufficient documentation

## 2017-02-18 NOTE — Progress Notes (Addendum)
US scheduled for June 8th @ 1400.  Pt notified.   Pt has established PCP.

## 2017-02-18 NOTE — Progress Notes (Signed)
Pt presents with c/o feeling lightheaded and dizzy off/on since this past Thursday. She also has had some  Lower abd/pelvic pain. She thought her Sx might be related to her Mirena IUD which was placed this past March. She does not have a cycle with the Mirena. Sexual active without problems. 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  PE AF VSS Lungs clear Heart RRR Abd soft + BS non tender GU Nl EGBUS IUD strings noted and trimmed uterus small mobile non tender no adnexal tender slight bladder tenderness  A/P Pelvic/abd pain        Lightheadedness        IUD contraception  Do not think pt's Sx are related to her IUD. However will check labs, UC and GYN U/S . F/U pending test results, most likely will need to see PCP.

## 2017-02-19 LAB — COMPREHENSIVE METABOLIC PANEL
ALBUMIN: 4.4 g/dL (ref 3.5–5.5)
ALK PHOS: 104 IU/L (ref 39–117)
ALT: 12 IU/L (ref 0–32)
AST: 16 IU/L (ref 0–40)
Albumin/Globulin Ratio: 1.6 (ref 1.2–2.2)
BUN / CREAT RATIO: 12 (ref 9–23)
BUN: 8 mg/dL (ref 6–20)
Bilirubin Total: 0.2 mg/dL (ref 0.0–1.2)
CO2: 26 mmol/L (ref 18–29)
CREATININE: 0.67 mg/dL (ref 0.57–1.00)
Calcium: 9 mg/dL (ref 8.7–10.2)
Chloride: 101 mmol/L (ref 96–106)
GFR calc Af Amer: 142 mL/min/{1.73_m2} (ref >59)
GFR calc non Af Amer: 123 mL/min/{1.73_m2} (ref >59)
Globulin, Total: 2.8 g/dL (ref 1.5–4.5)
Glucose: 92 mg/dL (ref 65–99)
Potassium: 3.6 mmol/L (ref 3.5–5.2)
Sodium: 140 mmol/L (ref 134–144)
Total Protein: 7.2 g/dL (ref 6.0–8.5)

## 2017-02-19 LAB — CBC
HEMOGLOBIN: 12.4 g/dL (ref 11.1–15.9)
Hematocrit: 39.8 % (ref 34.0–46.6)
MCH: 26.1 pg — ABNORMAL LOW (ref 26.6–33.0)
MCHC: 31.2 g/dL — AB (ref 31.5–35.7)
MCV: 84 fL (ref 79–97)
PLATELETS: 259 10*3/uL (ref 150–379)
RBC: 4.75 x10E6/uL (ref 3.77–5.28)
RDW: 14.3 % (ref 12.3–15.4)
WBC: 4.1 10*3/uL (ref 3.4–10.8)

## 2017-02-20 LAB — URINE CULTURE

## 2017-02-22 ENCOUNTER — Telehealth: Payer: Self-pay | Admitting: General Practice

## 2017-02-22 ENCOUNTER — Ambulatory Visit (HOSPITAL_COMMUNITY): Payer: Commercial Managed Care - PPO

## 2017-02-22 NOTE — Telephone Encounter (Signed)
-----   Message from Erin StaggersMichael L Ervin, MD sent at 02/19/2017 10:08 AM EDT ----- Please let pt know that her blood work is normal. If still having lightheadedness recommend her see her PCP.  Thanks Casimiro NeedleMichael

## 2017-02-22 NOTE — Telephone Encounter (Signed)
Called patient, no answer- left message stating we are calling to inform you your recent lab work was normal. If you have any questions you may call us back.

## 2017-03-21 ENCOUNTER — Ambulatory Visit (INDEPENDENT_AMBULATORY_CARE_PROVIDER_SITE_OTHER): Payer: Commercial Managed Care - PPO | Admitting: Obstetrics & Gynecology

## 2017-03-21 ENCOUNTER — Encounter: Payer: Self-pay | Admitting: Obstetrics & Gynecology

## 2017-03-21 VITALS — BP 127/77 | HR 70 | Wt 189.6 lb

## 2017-03-21 DIAGNOSIS — Z30011 Encounter for initial prescription of contraceptive pills: Secondary | ICD-10-CM

## 2017-03-21 DIAGNOSIS — Z30432 Encounter for removal of intrauterine contraceptive device: Secondary | ICD-10-CM | POA: Diagnosis not present

## 2017-03-21 MED ORDER — NORETHIN ACE-ETH ESTRAD-FE 1-20 MG-MCG PO TABS: 1.0000 | ORAL_TABLET | Freq: Every day | ORAL | 11 refills | Status: DC

## 2017-03-21 NOTE — Progress Notes (Signed)
Pt presents with request for IUD removal. She reports that she has tried to manage the sx but, c/o dizziness that she believes is related to the IUD and cramping not relieved with NSAIDS.  She denies other problems currently.   Patient was in the dorsal lithotomy position, normal external genitalia was noted.  A speculum was placed in the patient's vagina, normal discharge was noted, no lesions. The multiparous cervix was visualized, no lesions, no abnormal discharge;  and the cervix was swabbed with Betadine using scopettes. The strings of the IUD were grasped and pulled using ring forceps.  The IUD was successfully removed in its entirety.  Patient tolerated the procedure well.    Pt prescribed OCPs- Loestrin 1/20 1 po q day. She has no contraindications to OCPs  Ysidro Ramsay L. Harraway-Smith, M.D., Evern CoreFACOG

## 2017-03-21 NOTE — Patient Instructions (Signed)
Oral Contraception Use Oral contraceptive pills (OCPs) are medicines taken to prevent pregnancy. OCPs work by preventing the ovaries from releasing eggs. The hormones in OCPs also cause the cervical mucus to thicken, preventing the sperm from entering the uterus. The hormones also cause the uterine lining to become thin, not allowing a fertilized egg to attach to the inside of the uterus. OCPs are highly effective when taken exactly as prescribed. However, OCPs do not prevent sexually transmitted diseases (STDs). Safe sex practices, such as using condoms along with an OCP, can help prevent STDs. Before taking OCPs, you may have a physical exam and Pap test. Your health care provider may also order blood tests if necessary. Your health care provider will make sure you are a good candidate for oral contraception. Discuss with your health care provider the possible side effects of the OCP you may be prescribed. When starting an OCP, it can take 2 to 3 months for the body to adjust to the changes in hormone levels in your body. How to take oral contraceptive pills Your health care provider may advise you on how to start taking the first cycle of OCPs. Otherwise, you can:  Start on day 1 of your menstrual period. You will not need any backup contraceptive protection with this start time.  Start on the first Sunday after your menstrual period or the day you get your prescription. In these cases, you will need to use backup contraceptive protection for the first week.  Start the pill at any time of your cycle. If you take the pill within 5 days of the start of your period, you are protected against pregnancy right away. In this case, you will not need a backup form of birth control. If you start at any other time of your menstrual cycle, you will need to use another form of birth control for 7 days. If your OCP is the type called a minipill, it will protect you from pregnancy after taking it for 2 days (48  hours).  After you have started taking OCPs:  If you forget to take 1 pill, take it as soon as you remember. Take the next pill at the regular time.  If you miss 2 or more pills, call your health care provider because different pills have different instructions for missed doses. Use backup birth control until your next menstrual period starts.  If you use a 28-day pack that contains inactive pills and you miss 1 of the last 7 pills (pills with no hormones), it will not matter. Throw away the rest of the non-hormone pills and start a new pill pack.  No matter which day you start the OCP, you will always start a new pack on that same day of the week. Have an extra pack of OCPs and a backup contraceptive method available in case you miss some pills or lose your OCP pack. Follow these instructions at home:  Do not smoke.  Always use a condom to protect against STDs. OCPs do not protect against STDs.  Use a calendar to mark your menstrual period days.  Read the information and directions that came with your OCP. Talk to your health care provider if you have questions. Contact a health care provider if:  You develop nausea and vomiting.  You have abnormal vaginal discharge or bleeding.  You develop a rash.  You miss your menstrual period.  You are losing your hair.  You need treatment for mood swings or depression.  You   get dizzy when taking the OCP.  You develop acne from taking the OCP.  You become pregnant. Get help right away if:  You develop chest pain.  You develop shortness of breath.  You have an uncontrolled or severe headache.  You develop numbness or slurred speech.  You develop visual problems.  You develop pain, redness, and swelling in the legs. This information is not intended to replace advice given to you by your health care provider. Make sure you discuss any questions you have with your health care provider. Document Released: 08/23/2011 Document  Revised: 02/09/2016 Document Reviewed: 02/22/2013 Elsevier Interactive Patient Education  2017 Elsevier Inc.  

## 2017-08-26 ENCOUNTER — Ambulatory Visit: Payer: Commercial Managed Care - PPO | Admitting: Obstetrics & Gynecology

## 2017-08-28 ENCOUNTER — Ambulatory Visit: Payer: Self-pay | Admitting: Obstetrics & Gynecology

## 2017-12-31 ENCOUNTER — Encounter: Payer: Self-pay | Admitting: *Deleted

## 2018-02-27 ENCOUNTER — Other Ambulatory Visit: Payer: Self-pay

## 2018-02-27 ENCOUNTER — Ambulatory Visit: Payer: BLUE CROSS/BLUE SHIELD

## 2018-02-27 VITALS — BP 128/82 | HR 67 | Wt 201.0 lb

## 2018-02-27 DIAGNOSIS — N912 Amenorrhea, unspecified: Secondary | ICD-10-CM

## 2018-02-27 LAB — POCT PREGNANCY, URINE: Preg Test, Ur: POSITIVE — AB

## 2018-02-27 MED ORDER — COMPLETENATE 29-1 MG PO CHEW: 1.0000 | CHEWABLE_TABLET | Freq: Every day | ORAL | Status: DC

## 2018-02-27 NOTE — Progress Notes (Signed)
Patient here for pregnancy test.  Test is positive.  LMP:01/19/2018  EDD:10/29/2018.  Prenatal rx sent to pharmacy.  Patient will schedule [facility].  She is interested in VBAC and possible water birth.  History of 2 prior section.

## 2018-02-27 NOTE — Patient Instructions (Signed)

## 2018-03-06 ENCOUNTER — Other Ambulatory Visit: Payer: Self-pay

## 2018-03-06 MED ORDER — PRENATAL VITAMINS 0.8 MG PO TABS: 1.0000 | ORAL_TABLET | Freq: Every day | ORAL | 3 refills | Status: DC

## 2018-03-21 ENCOUNTER — Ambulatory Visit (INDEPENDENT_AMBULATORY_CARE_PROVIDER_SITE_OTHER): Payer: BLUE CROSS/BLUE SHIELD | Admitting: Obstetrics & Gynecology

## 2018-03-21 ENCOUNTER — Encounter: Payer: Self-pay | Admitting: Obstetrics & Gynecology

## 2018-03-21 DIAGNOSIS — Z3480 Encounter for supervision of other normal pregnancy, unspecified trimester: Secondary | ICD-10-CM

## 2018-03-21 DIAGNOSIS — Z113 Encounter for screening for infections with a predominantly sexual mode of transmission: Secondary | ICD-10-CM | POA: Diagnosis not present

## 2018-03-21 DIAGNOSIS — Z8489 Family history of other specified conditions: Secondary | ICD-10-CM

## 2018-03-21 DIAGNOSIS — Z3481 Encounter for supervision of other normal pregnancy, first trimester: Secondary | ICD-10-CM

## 2018-03-21 MED ORDER — DOXYLAMINE-PYRIDOXINE 10-10 MG PO TBEC: 1.0000 | DELAYED_RELEASE_TABLET | Freq: Two times a day (BID) | ORAL | 1 refills | Status: DC

## 2018-03-21 NOTE — Patient Instructions (Signed)
First Trimester of Pregnancy The first trimester of pregnancy is from week 1 until the end of week 13 (months 1 through 3). A week after a sperm fertilizes an egg, the egg will implant on the wall of the uterus. This embryo will begin to develop into a baby. Genes from you and your partner will form the baby. The female genes will determine whether the baby will be a boy or a girl. At 6-8 weeks, the eyes and face will be formed, and the heartbeat can be seen on ultrasound. At the end of 12 weeks, all the baby's organs will be formed. Now that you are pregnant, you will want to do everything you can to have a healthy baby. Two of the most important things are to get good prenatal care and to follow your health care provider's instructions. Prenatal care is all the medical care you receive before the baby's birth. This care will help prevent, find, and treat any problems during the pregnancy and childbirth. Body changes during your first trimester Your body goes through many changes during pregnancy. The changes vary from woman to woman.  You may gain or lose a couple of pounds at first.  You may feel sick to your stomach (nauseous) and you may throw up (vomit). If the vomiting is uncontrollable, call your health care provider.  You may tire easily.  You may develop headaches that can be relieved by medicines. All medicines should be approved by your health care provider.  You may urinate more often. Painful urination may mean you have a bladder infection.  You may develop heartburn as a result of your pregnancy.  You may develop constipation because certain hormones are causing the muscles that push stool through your intestines to slow down.  You may develop hemorrhoids or swollen veins (varicose veins).  Your breasts may begin to grow larger and become tender. Your nipples may stick out more, and the tissue that surrounds them (areola) may become darker.  Your gums may bleed and may be  sensitive to brushing and flossing.  Dark spots or blotches (chloasma, mask of pregnancy) may develop on your face. This will likely fade after the baby is born.  Your menstrual periods will stop.  You may have a loss of appetite.  You may develop cravings for certain kinds of food.  You may have changes in your emotions from day to day, such as being excited to be pregnant or being concerned that something may go wrong with the pregnancy and baby.  You may have more vivid and strange dreams.  You may have changes in your hair. These can include thickening of your hair, rapid growth, and changes in texture. Some women also have hair loss during or after pregnancy, or hair that feels dry or thin. Your hair will most likely return to normal after your baby is born.  What to expect at prenatal visits During a routine prenatal visit:  You will be weighed to make sure you and the baby are growing normally.  Your blood pressure will be taken.  Your abdomen will be measured to track your baby's growth.  The fetal heartbeat will be listened to between weeks 10 and 14 of your pregnancy.  Test results from any previous visits will be discussed.  Your health care provider may ask you:  How you are feeling.  If you are feeling the baby move.  If you have had any abnormal symptoms, such as leaking fluid, bleeding, severe headaches,   or abdominal cramping.  If you are using any tobacco products, including cigarettes, chewing tobacco, and electronic cigarettes.  If you have any questions.  Other tests that may be performed during your first trimester include:  Blood tests to find your blood type and to check for the presence of any previous infections. The tests will also be used to check for low iron levels (anemia) and protein on red blood cells (Rh antibodies). Depending on your risk factors, or if you previously had diabetes during pregnancy, you may have tests to check for high blood  sugar that affects pregnant women (gestational diabetes).  Urine tests to check for infections, diabetes, or protein in the urine.  An ultrasound to confirm the proper growth and development of the baby.  Fetal screens for spinal cord problems (spina bifida) and Down syndrome.  HIV (human immunodeficiency virus) testing. Routine prenatal testing includes screening for HIV, unless you choose not to have this test.  You may need other tests to make sure you and the baby are doing well.  Follow these instructions at home: Medicines  Follow your health care provider's instructions regarding medicine use. Specific medicines may be either safe or unsafe to take during pregnancy.  Take a prenatal vitamin that contains at least 600 micrograms (mcg) of folic acid.  If you develop constipation, try taking a stool softener if your health care provider approves. Eating and drinking  Eat a balanced diet that includes fresh fruits and vegetables, whole grains, good sources of protein such as meat, eggs, or tofu, and low-fat dairy. Your health care provider will help you determine the amount of weight gain that is right for you.  Avoid raw meat and uncooked cheese. These carry germs that can cause birth defects in the baby.  Eating four or five small meals rather than three large meals a day may help relieve nausea and vomiting. If you start to feel nauseous, eating a few soda crackers can be helpful. Drinking liquids between meals, instead of during meals, also seems to help ease nausea and vomiting.  Limit foods that are high in fat and processed sugars, such as fried and sweet foods.  To prevent constipation: ? Eat foods that are high in fiber, such as fresh fruits and vegetables, whole grains, and beans. ? Drink enough fluid to keep your urine clear or pale yellow. Activity  Exercise only as directed by your health care provider. Most women can continue their usual exercise routine during  pregnancy. Try to exercise for 30 minutes at least 5 days a week. Exercising will help you: ? Control your weight. ? Stay in shape. ? Be prepared for labor and delivery.  Experiencing pain or cramping in the lower abdomen or lower back is a good sign that you should stop exercising. Check with your health care provider before continuing with normal exercises.  Try to avoid standing for long periods of time. Move your legs often if you must stand in one place for a long time.  Avoid heavy lifting.  Wear low-heeled shoes and practice good posture.  You may continue to have sex unless your health care provider tells you not to. Relieving pain and discomfort  Wear a good support bra to relieve breast tenderness.  Take warm sitz baths to soothe any pain or discomfort caused by hemorrhoids. Use hemorrhoid cream if your health care provider approves.  Rest with your legs elevated if you have leg cramps or low back pain.  If you develop   varicose veins in your legs, wear support hose. Elevate your feet for 15 minutes, 3-4 times a day. Limit salt in your diet. Prenatal care  Schedule your prenatal visits by the twelfth week of pregnancy. They are usually scheduled monthly at first, then more often in the last 2 months before delivery.  Write down your questions. Take them to your prenatal visits.  Keep all your prenatal visits as told by your health care provider. This is important. Safety  Wear your seat belt at all times when driving.  Make a list of emergency phone numbers, including numbers for family, friends, the hospital, and police and fire departments. General instructions  Ask your health care provider for a referral to a local prenatal education class. Begin classes no later than the beginning of month 6 of your pregnancy.  Ask for help if you have counseling or nutritional needs during pregnancy. Your health care provider can offer advice or refer you to specialists for help  with various needs.  Do not use hot tubs, steam rooms, or saunas.  Do not douche or use tampons or scented sanitary pads.  Do not cross your legs for long periods of time.  Avoid cat litter boxes and soil used by cats. These carry germs that can cause birth defects in the baby and possibly loss of the fetus by miscarriage or stillbirth.  Avoid all smoking, herbs, alcohol, and medicines not prescribed by your health care provider. Chemicals in these products affect the formation and growth of the baby.  Do not use any products that contain nicotine or tobacco, such as cigarettes and e-cigarettes. If you need help quitting, ask your health care provider. You may receive counseling support and other resources to help you quit.  Schedule a dentist appointment. At home, brush your teeth with a soft toothbrush and be gentle when you floss. Contact a health care provider if:  You have dizziness.  You have mild pelvic cramps, pelvic pressure, or nagging pain in the abdominal area.  You have persistent nausea, vomiting, or diarrhea.  You have a bad smelling vaginal discharge.  You have pain when you urinate.  You notice increased swelling in your face, hands, legs, or ankles.  You are exposed to fifth disease or chickenpox.  You are exposed to German measles (rubella) and have never had it. Get help right away if:  You have a fever.  You are leaking fluid from your vagina.  You have spotting or bleeding from your vagina.  You have severe abdominal cramping or pain.  You have rapid weight gain or loss.  You vomit blood or material that looks like coffee grounds.  You develop a severe headache.  You have shortness of breath.  You have any kind of trauma, such as from a fall or a car accident. Summary  The first trimester of pregnancy is from week 1 until the end of week 13 (months 1 through 3).  Your body goes through many changes during pregnancy. The changes vary from  woman to woman.  You will have routine prenatal visits. During those visits, your health care provider will examine you, discuss any test results you may have, and talk with you about how you are feeling. This information is not intended to replace advice given to you by your health care provider. Make sure you discuss any questions you have with your health care provider. Document Released: 08/28/2001 Document Revised: 08/15/2016 Document Reviewed: 08/15/2016 Elsevier Interactive Patient Education  2018 Elsevier   Inc.  

## 2018-03-21 NOTE — Progress Notes (Signed)
  Subjective:    Erin [name] is a G3P1101 4536w5d being seen today for her first obstetrical visit.  Her obstetrical history is significant for abruption and neonatal demise, fetal anomalies. 2 prior CS and would like VBAC. Patient does intend to breast feed. Pregnancy history fully reviewed.  Patient reports nausea, vomiting and diarrhea.  Vitals:   03/21/18 0914  BP: 118/79  Pulse: 74  Weight: 198 lb 8 oz (90 kg)    HISTORY: OB History  Gravida Para Term Preterm AB Living  3 2 1 1  0 1  SAB TAB Ectopic Multiple Live Births  0 0 0 0 1    # Outcome Date GA Lbr Len/2nd Weight Sex Delivery Anes PTL Lv  3 Current           2 Term 07/26/16 728w1d  9 lb 4.7 oz (4.215 kg) M CS-LTranv Spinal  LIV     Birth Comments: normal female infant  1 Preterm 04/08/14 7452w4d  4 lb 14 oz (2.211 kg) M CS-LTranv Gen  ND     Complications: Antepartum placental abruption    Obstetric Comments  G1: 36wk stat pLTCS @ [facility] (VB, ?abruption, oligo bradycardia and multiple fetal anomalies). Demised neonate.    Past Medical History:  Diagnosis Date  . Medical history non-contributory    Past Surgical History:  Procedure Laterality Date  . CESAREAN SECTION    . CESAREAN SECTION N/A 07/26/2016   Procedure: CESAREAN SECTION;  Surgeon: Adam PhenixJames G Edra Riccardi, MD;  [facility];  Service: Obstetrics;  Laterality: N/A;  . egg donor x2      Family History  Problem Relation Age of Onset  . Cancer Mother   . Hypertension Father   . Cancer Father      Exam    Uterus:     Pelvic Exam:    Perineum: No Hemorrhoids   Vulva:    Vagina:  deferred   pH:    Cervix:    Adnexa: not evaluated   Bony Pelvis:   System: Breast:  normal appearance, no masses or tenderness   Skin: normal coloration and turgor, no rashes    Neurologic: oriented, normal mood   Extremities: normal strength, tone, and muscle mass   HEENT PERRLA and sclera clear, anicteric   Mouth/Teeth mucous membranes moist,  pharynx normal without lesions and dental hygiene good   Neck supple and no masses   Cardiovascular: regular rate and rhythm, no murmurs or gallops   Respiratory:  appears well, vitals normal, no respiratory distress, acyanotic, normal RR, neck free of mass or lymphadenopathy, chest clear, no wheezing, crepitations, rhonchi, normal symmetric air entry   Abdomen: soft, non-tender; bowel sounds normal; no masses,  no organomegaly   Urinary:        Assessment:    Pregnancy: [id number] Patient Active Problem List   Diagnosis Date Noted  . Supervision of other normal pregnancy, antepartum 03/21/2018  . Family history of unexplained neonatal death 08/29/2016  . S/P cesarean section 07/27/2016  . 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 Prenatal vitamins. Problem list reviewed and updated. Genetic Screening discussed First Screen: done last pregnancy, wants Panorama.  Ultrasound discussed; fetal survey: 18+ weeks.  Follow up in 4 weeks. 50% of 30 min visit spent on counseling and coordination of care.  Discussed TOLAC after 2 CS,    Scheryl DarterJames [name] 03/21/2018

## 2018-03-21 NOTE — Progress Notes (Signed)
Bedside US completed for viability.  Single IUP visualized Yolk sac present FHR - 173 per M-mode CRL- 2.18 cm (8w 5d)  Results discussed w/Dr. Debroah LoopArnold

## 2018-03-23 LAB — CULTURE, OB URINE

## 2018-03-23 LAB — URINE CULTURE, OB REFLEX

## 2018-03-24 LAB — CERVICOVAGINAL ANCILLARY ONLY
CHLAMYDIA, DNA PROBE: NEGATIVE
NEISSERIA GONORRHEA: NEGATIVE

## 2018-03-24 IMAGING — US US MFM OB DETAIL+14 WK
1 series · 14 of 28 positions shown · non-contrast
Comparison: none

[Series 1: us mfm ob detail+14 wk · 14 of 86 slices shown]
[im 4/86]
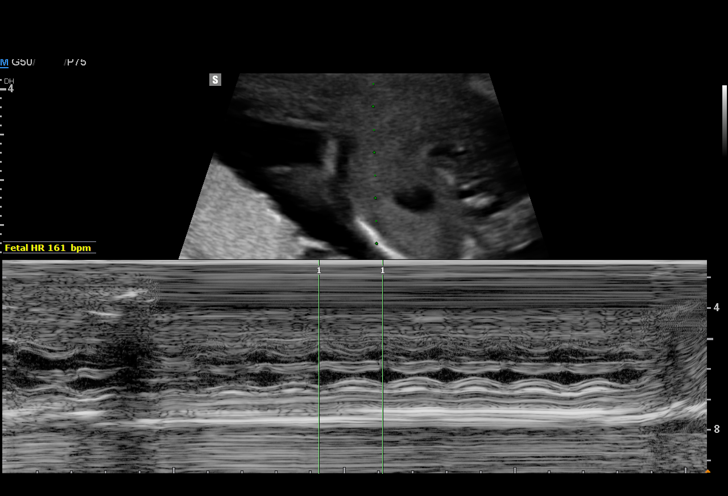
[im 10/86]
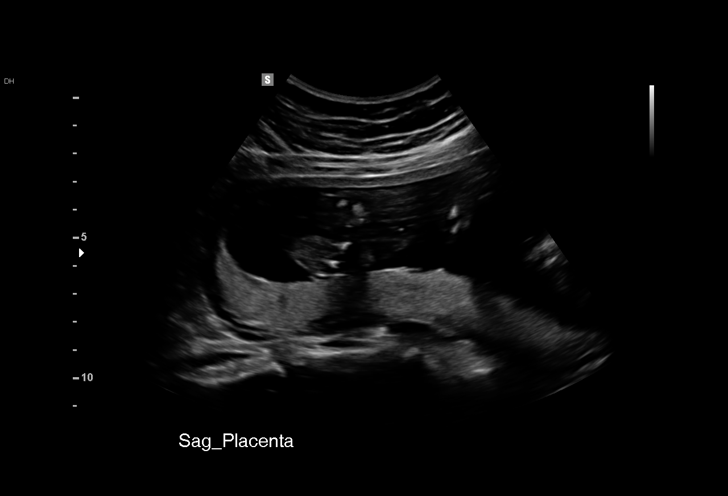
[im 16/86]
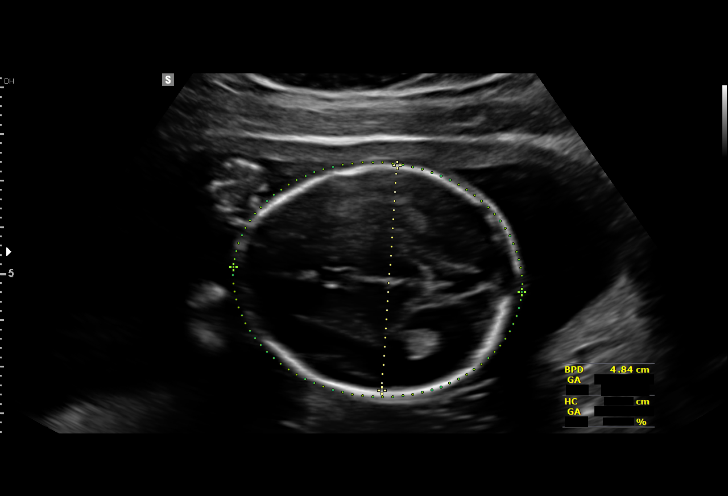
[im 23/86]
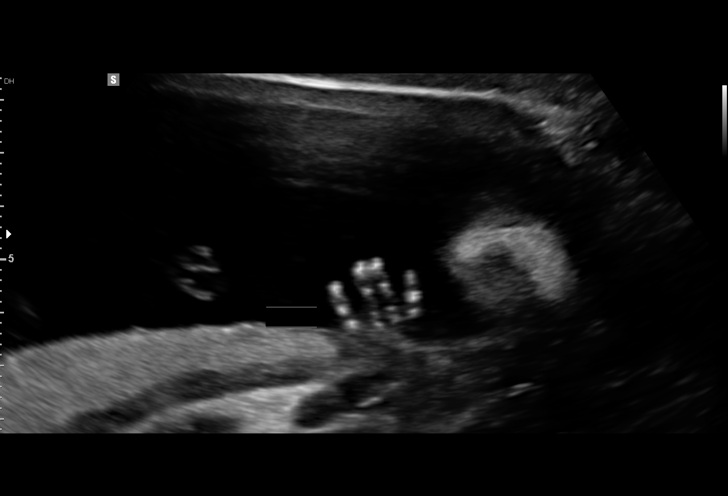
[im 29/86]
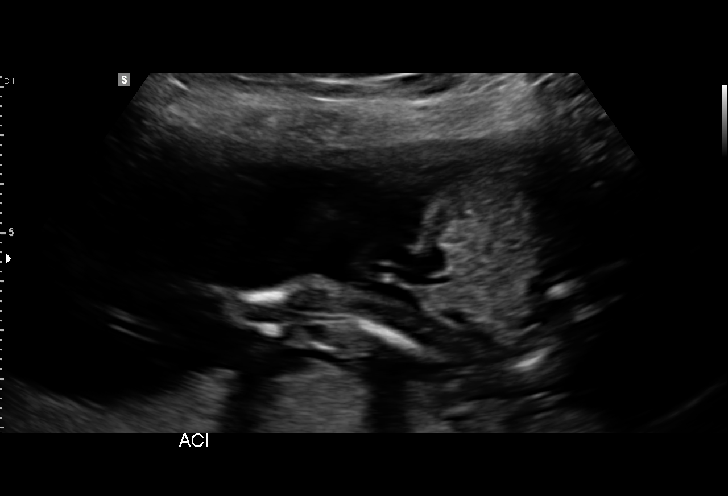
[im 35/86]
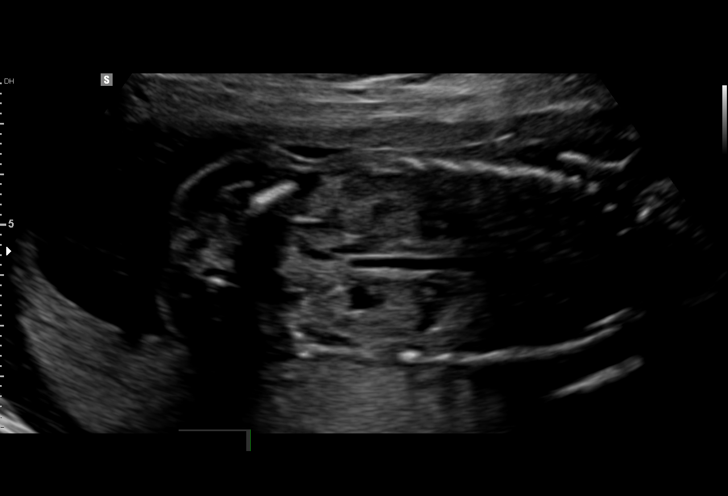
[im 41/86]
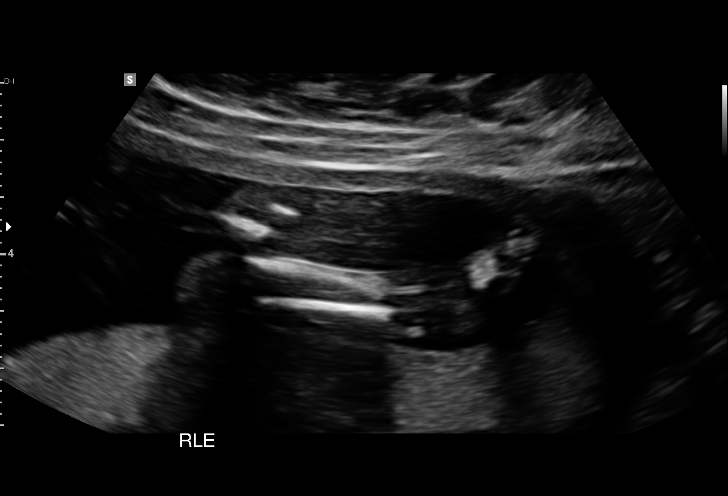
[im 48/86]
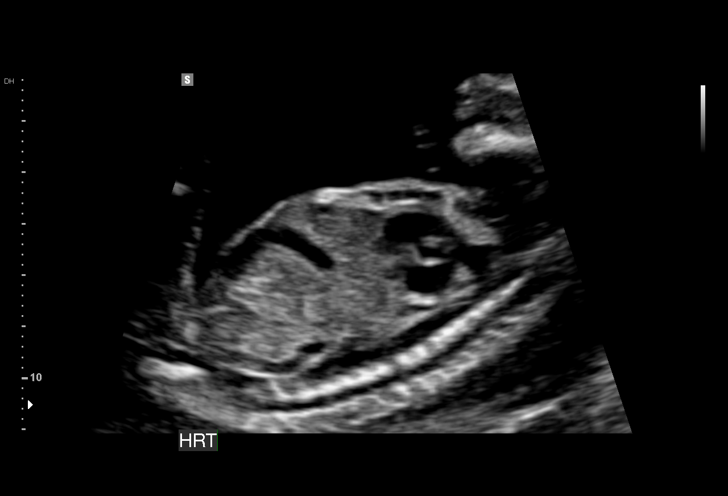
[im 54/86]
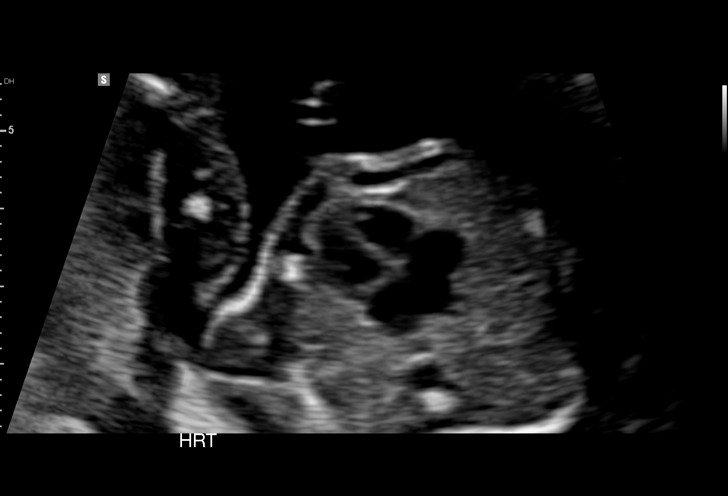
[im 60/86]
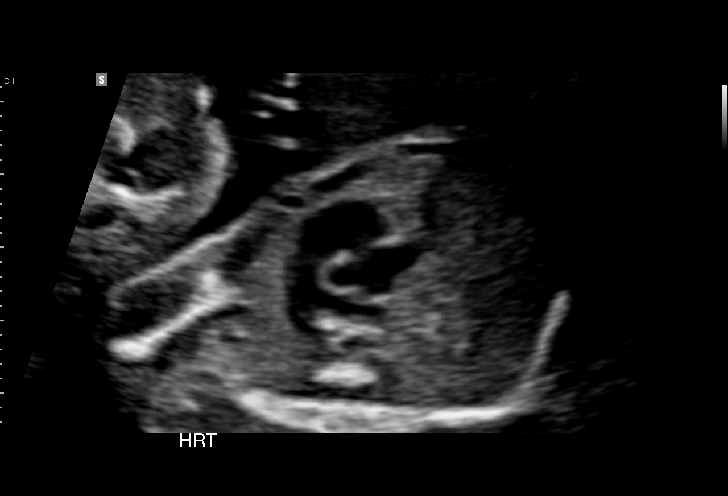
[im 67/86]
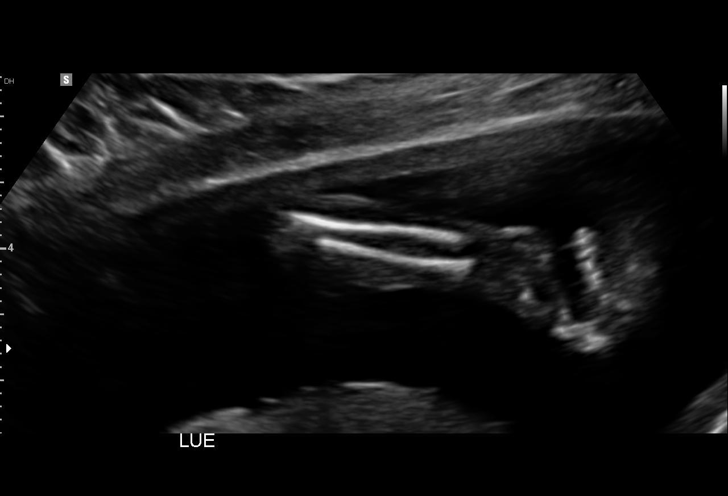
[im 73/86]
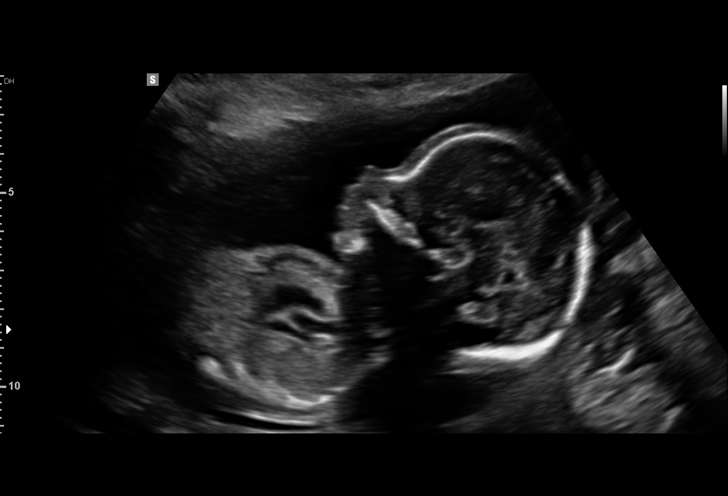
[im 79/86]
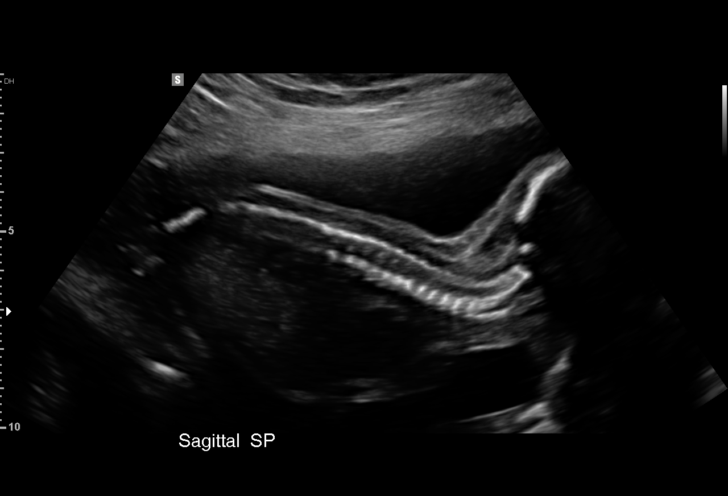
[im 86/86]
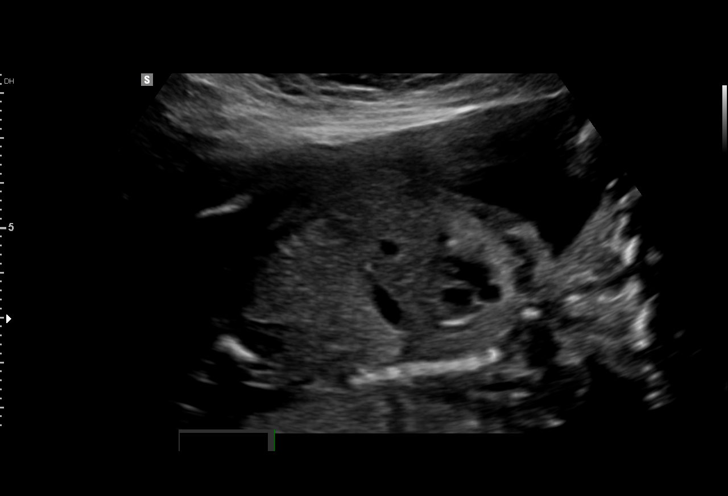

[14 of 28 positions shown; findings below may reference images not displayed]

Hospital Clinic-
Faculty Physician
OB/Gyn Clinic
[facility]

1  SIGFINNUR TULLIER            [id number]      [id number]     [id number]
Indications

19 weeks gestation of pregnancy
Detailed fetal anatomic survey                 Z36

History of fetal abnormality in previous
pregnancy (? bladder outlet obstruction with
oligo)
Poor obstetric history: Previous neonatal
death
Poor obstetrical history (abruption at 37
weeks)
Previous cesarean delivery, antepartum
OB History

Gravidity:    2         Prem:   1
Living:       01
Fetal Evaluation

Num Of Fetuses:     1
Fetal Heart         161
Rate(bpm):
Cardiac Activity:   Observed
Presentation:       Variable
Placenta:           Posterior, above cervical os
P. Cord Insertion:  Visualized, central

Amniotic Fluid
AFI FV:      Subjectively within normal limits

Largest Pocket(cm)
4.6
Biometry

BPD:      48.6  mm     G. Age:  20w 5d         94  %    CI:         75.25  %    70 - 86
FL/HC:       16.7  %    16.1 -
HC:      177.7  mm     G. Age:  20w 2d         83  %    HC/AC:       1.14       1.09 -
AC:      156.3  mm     G. Age:  20w 6d         88  %    FL/BPD:      60.9  %
FL:       29.6  mm     G. Age:  19w 1d         38  %    FL/AC:       18.9  %    20 - 24
HUM:      29.3  mm     G. Age:  19w 4d         58  %
CER:      19.4  mm     G. Age:  18w 5d         35  %
NFT:       3.1  mm

CM:        4.6  mm

Est. FW:     330   gm    0 lb 12 oz     57  %
Gestational Age

U/S Today:     20w 2d                                        EDD:    07/25/16
Best:          19w 2d     Det. By:  Early Ultrasound         EDD:    08/01/16
(12/19/15)
Anatomy

Cranium:               Appears normal         Aortic Arch:            Appears normal
Cavum:                 Appears normal         Ductal Arch:            Appears normal
Ventricles:            Appears normal         Diaphragm:              Appears normal
Choroid Plexus:        Appears normal         Stomach:                Appears normal, left
sided
Cerebellum:            Appears normal         Abdomen:                Appears normal
Posterior Fossa:       Appears normal         Abdominal Wall:         Appears nml (cord
insert, abd wall)
Nuchal Fold:           Appears normal         Cord Vessels:           Appears normal (3
vessel cord)
Face:                  Appears normal         Kidneys:                Appear normal
(orbits and profile)
Lips:                  Appears normal         Bladder:                Appears normal
Thoracic:              Appears normal         Spine:                  Appears normal
Heart:                 Appears normal         Upper Extremities:      Appears normal
(4CH, axis, and
situs)
RVOT:                  Appears normal         Lower Extremities:      Appears normal
LVOT:                  Appears normal

Other:  Fetus appears to be a male. Heels and 5th digit visualized. Nasal
bone visualized.
Cervix Uterus Adnexa

Cervix
Length:            3.9  cm.
Normal appearance by transabdominal scan.
Left Ovary
Within normal limits.

Right Ovary
Within normal limits.

Adnexa:       No abnormality visualized.
Impression

SIUP at 19+2 weeks
Normal detailed fetal anatomy
Markers of aneuploidy: none
Normal amniotic fluid volume
Measurements consistent with early US
Recommendations

Follow-up as clinically indicated

## 2018-03-28 LAB — OBSTETRIC PANEL, INCLUDING HIV
Antibody Screen: NEGATIVE
BASOS ABS: 0 10*3/uL (ref 0.0–0.2)
Basos: 0 %
EOS (ABSOLUTE): 0.1 10*3/uL (ref 0.0–0.4)
Eos: 1 %
HIV Screen 4th Generation wRfx: NONREACTIVE
Hematocrit: 37.1 % (ref 34.0–46.6)
Hemoglobin: 11.9 g/dL (ref 11.1–15.9)
Hepatitis B Surface Ag: NEGATIVE
IMMATURE GRANS (ABS): 0 10*3/uL (ref 0.0–0.1)
Immature Granulocytes: 0 %
LYMPHS: 29 %
Lymphocytes Absolute: 1.6 10*3/uL (ref 0.7–3.1)
MCH: 25.8 pg — ABNORMAL LOW (ref 26.6–33.0)
MCHC: 32.1 g/dL (ref 31.5–35.7)
MCV: 80 fL (ref 79–97)
MONOCYTES: 7 %
Monocytes Absolute: 0.4 10*3/uL (ref 0.1–0.9)
NEUTROS PCT: 63 %
Neutrophils Absolute: 3.5 10*3/uL (ref 1.4–7.0)
PLATELETS: 329 10*3/uL (ref 150–450)
RBC: 4.62 x10E6/uL (ref 3.77–5.28)
RDW: 15.5 % — AB (ref 12.3–15.4)
RH TYPE: NEGATIVE
RPR Ser Ql: NONREACTIVE
RUBELLA: 1.5 {index} (ref >0.99)
WBC: 5.6 10*3/uL (ref 3.4–10.8)

## 2018-03-28 LAB — CYSTIC FIBROSIS GENE TEST

## 2018-03-28 LAB — SMN1 COPY NUMBER ANALYSIS (SMA CARRIER SCREENING)

## 2018-03-28 LAB — HEMOGLOBINOPATHY EVALUATION
Ferritin: 10 ng/mL — ABNORMAL LOW (ref 15–150)
Hgb A2 Quant: 1.8 % (ref 1.8–3.2)
Hgb A: 98.2 % (ref 96.4–98.8)
Hgb C: 0 %
Hgb F Quant: 0 % (ref 0.0–2.0)
Hgb S: 0 %
Hgb Solubility: NEGATIVE
Hgb Variant: 0 %

## 2018-04-02 ENCOUNTER — Encounter: Payer: Self-pay | Admitting: *Deleted

## 2018-04-03 ENCOUNTER — Encounter: Payer: Self-pay | Admitting: Advanced Practice Midwife

## 2018-04-03 ENCOUNTER — Other Ambulatory Visit: Payer: Self-pay | Admitting: Advanced Practice Midwife

## 2018-04-03 DIAGNOSIS — Z3480 Encounter for supervision of other normal pregnancy, unspecified trimester: Secondary | ICD-10-CM

## 2018-04-17 ENCOUNTER — Ambulatory Visit (INDEPENDENT_AMBULATORY_CARE_PROVIDER_SITE_OTHER): Payer: BLUE CROSS/BLUE SHIELD | Admitting: Obstetrics & Gynecology

## 2018-04-17 VITALS — BP 126/62 | HR 77 | Wt 199.0 lb

## 2018-04-17 DIAGNOSIS — Z3480 Encounter for supervision of other normal pregnancy, unspecified trimester: Secondary | ICD-10-CM

## 2018-04-17 DIAGNOSIS — Z98891 History of uterine scar from previous surgery: Secondary | ICD-10-CM

## 2018-04-17 MED ORDER — VITAMIN B-6 50 MG PO TABS: 50.0000 mg | ORAL_TABLET | Freq: Two times a day (BID) | ORAL | 1 refills | Status: DC

## 2018-04-17 NOTE — Progress Notes (Signed)
   PRENATAL VISIT NOTE  Subjective:  Erin [name] is a 27 y.o. G3P1101 at 6772w4d being seen today for ongoing prenatal care.  She is currently monitored for the following issues for this low-risk pregnancy and has ASCUS with positive high risk HPV; S/P cesarean section; Family history of unexplained neonatal death; and Supervision of other normal pregnancy, antepartum on their problem list.  Patient reports nausea.  Contractions: Not present. Vag. Bleeding: None.  Movement: Absent. Denies leaking of fluid.   The following portions of the patient's history were reviewed and updated as appropriate: allergies, current medications, past family history, past medical history, past social history, past surgical history and problem list. Problem list updated.  Objective:   Vitals:   04/17/18 1142  BP: 126/62  Pulse: 77  Weight: 199 lb (90.3 kg)    Fetal Status: Fetal Heart Rate (bpm): 151   Movement: Absent     General:  Alert, oriented and cooperative. Patient is in no acute distress.  Skin: Skin is warm and dry. No rash noted.   Cardiovascular: Normal heart rate noted  Respiratory: Normal respiratory effort, no problems with respiration noted  Abdomen: Soft, gravid, appropriate for gestational age.  Pain/Pressure: Absent     Pelvic: Cervical exam deferred        Extremities: Normal range of motion.  Edema: None  Mental Status: Normal mood and affect. Normal behavior. Normal judgment and thought content.   Assessment and Plan:  Pregnancy: G3P1101 at 2472w4d  1. Supervision of other normal pregnancy, antepartum nausea - Genetic Screening - pyridOXINE (VITAMIN B-6) 50 MG tablet; Take 1 tablet (50 mg total) by mouth 2 (two) times daily.  Dispense: 60 tablet; Refill: 1  2. S/P cesarean section   Preterm labor symptoms and general obstetric precautions including but not limited to vaginal bleeding, contractions, leaking of fluid and fetal movement were reviewed in detail with the  patient. Please refer to After Visit Summary for other counseling recommendations.  Return in about 1 month (around 05/15/2018).  Future Appointments  Date Time Provider Department Center  06/03/2018 10:15 AM [facility] US 4 [facility]    Scheryl DarterJames Jaryah Aracena, MD

## 2018-04-17 NOTE — Progress Notes (Signed)
Patient requests nausea medication- Diclegis not covered by insurance

## 2018-04-17 NOTE — Patient Instructions (Addendum)
Second Trimester of Pregnancy The second trimester is from week 13 through week 28, month 4 through 6. This is often the time in pregnancy that you feel your best. Often times, morning sickness has lessened or quit. You may have more energy, and you may get hungry more often. Your unborn baby (fetus) is growing rapidly. At the end of the sixth month, he or she is about 9 inches long and weighs about 1 pounds. You will likely feel the baby move (quickening) between 18 and 20 weeks of pregnancy. Follow these instructions at home:  Avoid all smoking, herbs, and alcohol. Avoid drugs not approved by your doctor.  Do not use any tobacco products, including cigarettes, chewing tobacco, and electronic cigarettes. If you need help quitting, ask your doctor. You may get counseling or other support to help you quit.  Only take medicine as told by your doctor. Some medicines are safe and some are not during pregnancy.  Exercise only as told by your doctor. Stop exercising if you start having cramps.  Eat regular, healthy meals.  Wear a good support bra if your breasts are tender.  Do not use hot tubs, steam rooms, or saunas.  Wear your seat belt when driving.  Avoid raw meat, uncooked cheese, and liter boxes and soil used by cats.  Take your prenatal vitamins.  Take 1500-2000 milligrams of calcium daily starting at the 20th week of pregnancy until you deliver your baby.  Try taking medicine that helps you poop (stool softener) as needed, and if your doctor approves. Eat more fiber by eating fresh fruit, vegetables, and whole grains. Drink enough fluids to keep your pee (urine) clear or pale yellow.  Take warm water baths (sitz baths) to soothe pain or discomfort caused by hemorrhoids. Use hemorrhoid cream if your doctor approves.  If you have puffy, bulging veins (varicose veins), wear support hose. Raise (elevate) your feet for 15 minutes, 3-4 times a day. Limit salt in your diet.  Avoid heavy  lifting, wear low heals, and sit up straight.  Rest with your legs raised if you have leg cramps or low back pain.  Visit your dentist if you have not gone during your pregnancy. Use a soft toothbrush to brush your teeth. Be gentle when you floss.  You can have sex (intercourse) unless your doctor tells you not to.  Go to your doctor visits. Get help if:  You feel dizzy.  You have mild cramps or pressure in your lower belly (abdomen).  You have a nagging pain in your belly area.  You continue to feel sick to your stomach (nauseous), throw up (vomit), or have watery poop (diarrhea).  You have bad smelling fluid coming from your vagina.  You have pain with peeing (urination). Get help right away if:  You have a fever.  You are leaking fluid from your vagina.  You have spotting or bleeding from your vagina.  You have severe belly cramping or pain.  You lose or gain weight rapidly.  You have trouble catching your breath and have chest pain.  You notice sudden or extreme puffiness (swelling) of your face, hands, ankles, feet, or legs.  You have not felt the baby move in over an hour.  You have severe headaches that do not go away with medicine.  You have vision changes. This information is not intended to replace advice given to you by your health care provider. Make sure you discuss any questions you have with your health care   provider. Document Released: 11/28/2009 Document Revised: 02/09/2016 Document Reviewed: 11/04/2012 Elsevier Interactive Patient Education  2017 Elsevier Inc.  

## 2018-04-30 ENCOUNTER — Telehealth: Payer: Self-pay

## 2018-04-30 ENCOUNTER — Encounter: Payer: Self-pay | Admitting: *Deleted

## 2018-04-30 MED ORDER — PROMETHAZINE HCL 25 MG PO TABS: 25.0000 mg | ORAL_TABLET | Freq: Four times a day (QID) | ORAL | 1 refills | Status: DC | PRN

## 2018-04-30 NOTE — Telephone Encounter (Signed)
E-prescribed Phenergan 25 mg tablet per request of pt for nausea.

## 2018-05-03 ENCOUNTER — Inpatient Hospital Stay (HOSPITAL_COMMUNITY)
Admission: AD | Admit: 2018-05-03 | Discharge: 2018-05-03 | Disposition: A | Discharge status: Home / Self Care | Payer: BLUE CROSS/BLUE SHIELD | Source: Ambulatory Visit | Attending: Family Medicine | Admitting: Family Medicine

## 2018-05-03 ENCOUNTER — Other Ambulatory Visit: Payer: Self-pay

## 2018-05-03 ENCOUNTER — Encounter (HOSPITAL_COMMUNITY): Payer: Self-pay | Admitting: *Deleted

## 2018-05-03 DIAGNOSIS — O210 Mild hyperemesis gravidarum: Secondary | ICD-10-CM | POA: Insufficient documentation

## 2018-05-03 DIAGNOSIS — Z3480 Encounter for supervision of other normal pregnancy, unspecified trimester: Secondary | ICD-10-CM

## 2018-05-03 DIAGNOSIS — Z3A14 14 weeks gestation of pregnancy: Secondary | ICD-10-CM | POA: Diagnosis not present

## 2018-05-03 DIAGNOSIS — O9989 Other specified diseases and conditions complicating pregnancy, childbirth and the puerperium: Secondary | ICD-10-CM | POA: Diagnosis not present

## 2018-05-03 DIAGNOSIS — Z882 Allergy status to sulfonamides status: Secondary | ICD-10-CM | POA: Diagnosis not present

## 2018-05-03 DIAGNOSIS — R197 Diarrhea, unspecified: Secondary | ICD-10-CM | POA: Insufficient documentation

## 2018-05-03 DIAGNOSIS — O219 Vomiting of pregnancy, unspecified: Secondary | ICD-10-CM | POA: Diagnosis not present

## 2018-05-03 DIAGNOSIS — A09 Infectious gastroenteritis and colitis, unspecified: Secondary | ICD-10-CM

## 2018-05-03 DIAGNOSIS — O26892 Other specified pregnancy related conditions, second trimester: Secondary | ICD-10-CM | POA: Diagnosis not present

## 2018-05-03 LAB — URINALYSIS, ROUTINE W REFLEX MICROSCOPIC
BILIRUBIN URINE: NEGATIVE
Glucose, UA: NEGATIVE mg/dL
HGB URINE DIPSTICK: NEGATIVE
Ketones, ur: NEGATIVE mg/dL
Nitrite: NEGATIVE
PROTEIN: NEGATIVE mg/dL
Specific Gravity, Urine: 1.018 (ref 1.005–1.030)
pH: 6 (ref 5.0–8.0)

## 2018-05-03 MED ORDER — LOPERAMIDE HCL 2 MG PO CAPS: ORAL_CAPSULE | ORAL | 0 refills | Status: DC

## 2018-05-03 NOTE — MAU Note (Signed)
Pt has had nausea and diarrhea since Tuesday. Son had diarrhea last week. No bleeding, abdominal pain 3/10.

## 2018-05-03 NOTE — MAU Provider Note (Signed)
History    CSN: [id number] Arrival date and time: 05/03/18 1145  Chief Complaint  Patient presents with  . Nausea  . Diarrhea   HPI Erin [name] is a 27yo [id number] who presents with 5 days of diarrhea and mild abdominal cramping. She states her son had diarrhea initially, and then she started to have it. She states his only lasted a couple of days, and he was acting normally. She started having diarrhea 5 days ago, and it has persisted. She also has had less of an appetite than normal. She has had nausea and vomiting occasionally throughout her pregnancy and this has not changed. Her diarrhea was initially brown with some small stool but now it is completely watery and light green/brown. She denies any blood in her stools or emesis. She has 3 episodes of diarrhea per day. She is able to drink and eat some crackers/toast. She also tried potatoes. She had an apple last night which made the diarrhea worse. States overall she feels okay, just a little tired. Denies fevers, chills. Sometimes gets hot flashes before vomiting.   No recent travel, went to the beach a week ago. [place] ready [place] tomorrow.   Past Medical History:  Diagnosis Date  . Medical history non-contributory     Past Surgical History:  Procedure Laterality Date  . CESAREAN SECTION    . CESAREAN SECTION N/A 07/26/2016   Procedure: CESAREAN SECTION;  Surgeon: Adam PhenixJames G Arnold, MD;  Location: [facility];  Service: Obstetrics;  Laterality: N/A;  . egg donor x2       Family History  Problem Relation Age of Onset  . Cancer Mother   . Hypertension Father   . Cancer Father     Social History   Tobacco Use  . Smoking status: Never Smoker  . Smokeless tobacco: Never Used  Substance Use Topics  . Alcohol use: No  . Drug use: No    Allergies:  Allergies  Allergen Reactions  . Sulfa Antibiotics Rash    Medications Prior to Admission  Medication Sig Dispense Refill Last Dose  . acetaminophen  (TYLENOL) 160 MG/5ML elixir Take 15 mg/kg by mouth every 4 (four) hours as needed for fever.   Taking  . Prenatal Multivit-Min-Fe-FA (PRENATAL VITAMINS) 0.8 MG tablet Take 1 tablet by mouth daily. 90 tablet 3 Taking  . promethazine (PHENERGAN) 25 MG tablet Take 1 tablet (25 mg total) by mouth every 6 (six) hours as needed for nausea or vomiting. 30 tablet 1   . pyridOXINE (VITAMIN B-6) 50 MG tablet Take 1 tablet (50 mg total) by mouth 2 (two) times daily. 60 tablet 1     Review of Systems Physical Exam   Blood pressure 117/65, pulse 96, temperature 98 F (36.7 C), temperature source Oral, resp. rate 16, weight 89.8 kg, last menstrual period 01/19/2018, SpO2 100 %, not currently breastfeeding.  Physical Exam  Nursing note and vitals reviewed. Constitutional: She is oriented to person, place, and time. She appears well-developed and well-nourished. No distress.  HENT:  Head: Normocephalic and atraumatic.  Moist mucous membranes  Eyes: Conjunctivae and EOM are normal. No scleral icterus.  Neck: Neck supple. No thyromegaly present.  Cardiovascular: Normal rate, regular rhythm, normal heart sounds and intact distal pulses.  No murmur heard. Brisk cap refill   Respiratory: Effort normal and breath sounds normal. She has no wheezes.  GI: Soft. Bowel sounds are normal. There is no tenderness. There is no guarding.  Musculoskeletal: She exhibits no  edema.  Neurological: She is alert and oriented to person, place, and time.  Skin: Skin is warm and dry. No rash noted.  Psychiatric: She has a normal mood and affect. Her behavior is normal.   MAU Course  Procedures  MDM -- well-appearing, no TTP on exam, fetal dopplers reassuring 152  Assessment and Plan  27yo G3P1101 at 5011w6d who presents with diarrhea, nausea and vomiting in setting of toddler with recent diarrheal illness. Most likely infectious colitis. Nausea and vomiting unchanged from rest of pregnancy. Given 5 days of diarrhea,  recommend trial of bland diet and imodium prn use. Well-hydrated on exam, U/A with Sg 1.018 and no ketones, so low concern for dehydration at this time. Reviewed return precautions. If diarrhea persists past one week, would consider stool culture. Reviewed return precautions with patient who is in agreement with plan. She has follow-up OB appointments already scheduled in clinic.  Erin StandsLaurel S Wallace, DO 05/03/2018, 1:13 PM

## 2018-05-12 ENCOUNTER — Encounter: Payer: Self-pay | Admitting: Student

## 2018-05-15 ENCOUNTER — Encounter: Payer: Self-pay | Admitting: Student

## 2018-05-15 ENCOUNTER — Ambulatory Visit (INDEPENDENT_AMBULATORY_CARE_PROVIDER_SITE_OTHER): Payer: BLUE CROSS/BLUE SHIELD | Admitting: Student

## 2018-05-15 VITALS — BP 127/71 | HR 67 | Wt 203.0 lb

## 2018-05-15 DIAGNOSIS — Z3480 Encounter for supervision of other normal pregnancy, unspecified trimester: Secondary | ICD-10-CM

## 2018-05-15 NOTE — Progress Notes (Signed)
   PRENATAL VISIT NOTE  Subjective:  Erin [name] is a 27 y.o. G3P1101 at 1855w4d being seen today for ongoing prenatal care.  She is currently monitored for the following issues for this low-risk pregnancy and has Rh negative status during pregnancy; S/P cesarean section; Family history of unexplained neonatal death; and Supervision of other normal pregnancy, antepartum on their problem list.  Patient reports vaginal dryness & dyspareunia.  Contractions: Not present. Vag. Bleeding: None.  Movement: Absent. Denies leaking of fluid.   The following portions of the patient's history were reviewed and updated as appropriate: allergies, current medications, past family history, past medical history, past social history, past surgical history and problem list. Problem list updated.  Objective:   Vitals:   05/15/18 1511  BP: 127/71  Pulse: 67  Weight: 203 lb (92.1 kg)    Fetal Status: Fetal Heart Rate (bpm): 164   Movement: Absent   Fundal height 2 FB below umbilicus  General:  Alert, oriented and cooperative. Patient is in no acute distress.  Skin: Skin is warm and dry. No rash noted.   Cardiovascular: Normal heart rate noted  Respiratory: Normal respiratory effort, no problems with respiration noted  Abdomen: Soft, gravid, appropriate for gestational age.  Pain/Pressure: Absent     Pelvic: Cervical exam deferred        Extremities: Normal range of motion.  Edema: None  Mental Status: Normal mood and affect. Normal behavior. Normal judgment and thought content.   Assessment and Plan:  Pregnancy: G3P1101 at 1755w4d  1. Supervision of other normal pregnancy, antepartum -discussed AFP. Pt unsure & will decide at next visit   Preterm labor symptoms and general obstetric precautions including but not limited to vaginal bleeding, contractions, leaking of fluid and fetal movement were reviewed in detail with the patient. Please refer to After Visit Summary for other counseling  recommendations.  Return in about 4 weeks (around 06/12/2018) for Routine OB.  Future Appointments  Date Time Provider Department Center  06/03/2018 10:15 AM [facility] US 4 [facility]  06/10/2018  2:15 PM Kooistra, Charlesetta GaribaldiKathryn Lorraine, CNM [facility]    Judeth HornErin Annibelle Brazie, NP

## 2018-05-15 NOTE — Patient Instructions (Signed)
Second Trimester of Pregnancy The second trimester is from week 13 through week 28, month 4 through 6. This is often the time in pregnancy that you feel your best. Often times, morning sickness has lessened or quit. You may have more energy, and you may get hungry more often. Your unborn baby (fetus) is growing rapidly. At the end of the sixth month, he or she is about 9 inches long and weighs about 1 pounds. You will likely feel the baby move (quickening) between 18 and 20 weeks of pregnancy.  Research childbirth classes and hospital preregistration at [email or web address]  Follow these instructions at home:  Avoid all smoking, herbs, and alcohol. Avoid drugs not approved by your doctor.  Do not use any tobacco products, including cigarettes, chewing tobacco, and electronic cigarettes. If you need help quitting, ask your doctor. You may get counseling or other support to help you quit.  Only take medicine as told by your doctor. Some medicines are safe and some are not during pregnancy.  Exercise only as told by your doctor. Stop exercising if you start having cramps.  Eat regular, healthy meals.  Wear a good support bra if your breasts are tender.  Do not use hot tubs, steam rooms, or saunas.  Wear your seat belt when driving.  Avoid raw meat, uncooked cheese, and liter boxes and soil used by cats.  Take your prenatal vitamins.  Take 1500-2000 milligrams of calcium daily starting at the 20th week of pregnancy until you deliver your baby.  Try taking medicine that helps you poop (stool softener) as needed, and if your doctor approves. Eat more fiber by eating fresh fruit, vegetables, and whole grains. Drink enough fluids to keep your pee (urine) clear or pale yellow.  Take warm water baths (sitz baths) to soothe pain or discomfort caused by hemorrhoids. Use hemorrhoid cream if your doctor approves.  If you have puffy, bulging veins (varicose veins), wear support hose. Raise  (elevate) your feet for 15 minutes, 3-4 times a day. Limit salt in your diet.  Avoid heavy lifting, wear low heals, and sit up straight.  Rest with your legs raised if you have leg cramps or low back pain.  Visit your dentist if you have not gone during your pregnancy. Use a soft toothbrush to brush your teeth. Be gentle when you floss.  You can have sex (intercourse) unless your doctor tells you not to.  Go to your doctor visits.  Get help if:  You feel dizzy.  You have mild cramps or pressure in your lower belly (abdomen).  You have a nagging pain in your belly area.  You continue to feel sick to your stomach (nauseous), throw up (vomit), or have watery poop (diarrhea).  You have bad smelling fluid coming from your vagina.  You have pain with peeing (urination). Get help right away if:  You have a fever.  You are leaking fluid from your vagina.  You have spotting or bleeding from your vagina.  You have severe belly cramping or pain.  You lose or gain weight rapidly.  You have trouble catching your breath and have chest pain.  You notice sudden or extreme puffiness (swelling) of your face, hands, ankles, feet, or legs.  You have not felt the baby move in over an hour.  You have severe headaches that do not go away with medicine.  You have vision changes. This information is not intended to replace advice given to you by your health care provider. Make   sure you discuss any questions you have with your health care provider. Document Released: 11/28/2009 Document Revised: 02/09/2016 Document Reviewed: 11/04/2012 Elsevier Interactive Patient Education  2017 Elsevier Inc.    

## 2018-05-26 ENCOUNTER — Encounter (HOSPITAL_COMMUNITY): Payer: Self-pay

## 2018-06-03 ENCOUNTER — Other Ambulatory Visit: Payer: Self-pay | Admitting: Obstetrics & Gynecology

## 2018-06-03 ENCOUNTER — Ambulatory Visit (HOSPITAL_COMMUNITY)
Admission: RE | Admit: 2018-06-03 | Discharge: 2018-06-03 | Disposition: A | Discharge status: Home / Self Care | Payer: BLUE CROSS/BLUE SHIELD | Source: Ambulatory Visit | Attending: Obstetrics & Gynecology | Admitting: Obstetrics & Gynecology

## 2018-06-03 DIAGNOSIS — Z3482 Encounter for supervision of other normal pregnancy, second trimester: Secondary | ICD-10-CM | POA: Diagnosis present

## 2018-06-03 DIAGNOSIS — Z3A19 19 weeks gestation of pregnancy: Secondary | ICD-10-CM | POA: Insufficient documentation

## 2018-06-03 DIAGNOSIS — Z3182 Encounter for Rh incompatibility status: Secondary | ICD-10-CM | POA: Insufficient documentation

## 2018-06-03 DIAGNOSIS — Z363 Encounter for antenatal screening for malformations: Secondary | ICD-10-CM

## 2018-06-03 DIAGNOSIS — O34219 Maternal care for unspecified type scar from previous cesarean delivery: Secondary | ICD-10-CM | POA: Diagnosis not present

## 2018-06-03 DIAGNOSIS — O09212 Supervision of pregnancy with history of pre-term labor, second trimester: Secondary | ICD-10-CM

## 2018-06-03 DIAGNOSIS — Z3480 Encounter for supervision of other normal pregnancy, unspecified trimester: Secondary | ICD-10-CM

## 2018-06-10 ENCOUNTER — Ambulatory Visit (INDEPENDENT_AMBULATORY_CARE_PROVIDER_SITE_OTHER): Payer: BLUE CROSS/BLUE SHIELD | Admitting: Student

## 2018-06-10 VITALS — BP 123/79 | HR 83 | Wt 206.8 lb

## 2018-06-10 DIAGNOSIS — O26892 Other specified pregnancy related conditions, second trimester: Secondary | ICD-10-CM

## 2018-06-10 DIAGNOSIS — Z23 Encounter for immunization: Secondary | ICD-10-CM

## 2018-06-10 DIAGNOSIS — Z98891 History of uterine scar from previous surgery: Secondary | ICD-10-CM

## 2018-06-10 DIAGNOSIS — Z3480 Encounter for supervision of other normal pregnancy, unspecified trimester: Secondary | ICD-10-CM

## 2018-06-10 DIAGNOSIS — Z3482 Encounter for supervision of other normal pregnancy, second trimester: Secondary | ICD-10-CM

## 2018-06-10 DIAGNOSIS — Z6791 Unspecified blood type, Rh negative: Secondary | ICD-10-CM

## 2018-06-10 DIAGNOSIS — Z8489 Family history of other specified conditions: Secondary | ICD-10-CM

## 2018-06-10 NOTE — Patient Instructions (Addendum)
Vaginal Birth After Cesarean Delivery Vaginal birth after cesarean delivery (VBAC) is giving birth vaginally after previously delivering a baby by a cesarean. In the past, if a woman had a cesarean delivery, all births afterward would be done by cesarean delivery. This is no longer true. It can be safe for the mother to try a vaginal delivery after having a cesarean delivery. It is important to discuss VBAC with your health care provider early in the pregnancy so you can understand the risks, benefits, and options. It will give you time to decide what is best in your particular case. The final decision about whether to have a VBAC or repeat cesarean delivery should be between you and your health care provider. Any changes in your health or your baby's health during your pregnancy may make it necessary to change your initial decision about VBAC. Women who plan to have a VBAC should check with their health care provider to be sure that:  The previous cesarean delivery was done with a low transverse uterine cut (incision) (not a vertical classical incision).  The birth canal is big enough for the baby.  There were no other operations on the uterus.  An electronic fetal monitor (EFM) will be on at all times during labor.  An operating room will be available and ready in case an emergency cesarean delivery is needed.  A health care provider and surgical nursing staff will be available at all times during labor to be ready to do an emergency delivery cesarean if necessary.  An anesthesiologist will be present in case an emergency cesarean delivery is needed.  The nursery is prepared and has adequate personnel and necessary equipment available to care for the baby in case of an emergency cesarean delivery. Benefits of VBAC  Shorter stay in the hospital.  Avoidance of risks associated with cesarean delivery, such as: ? Surgical complications, such as opening of the incision or hernia in the  incision. ? Injury to other organs. ? Fever. This can occur if an infection develops after surgery. It can also occur as a reaction to the medicine given to make you numb during the surgery.  Less blood loss and need for blood transfusions.  Lower risk of blood clots and infection.  Shorter recovery.  Decreased risk for having to remove the uterus (hysterectomy).  Decreased risk for the placenta to completely or partially cover the opening of the uterus (placenta previa) with a future pregnancy.  Decrease risk in future labor and delivery. Risks of a VBAC  Tearing (rupture) of the uterus. This is occurs in less than 1% of VBACs. The risk of this happening is higher if: ? Steps are taken to begin the labor process (induce labor) or stimulate or strengthen contractions (augment labor). ? Medicine is used to soften (ripen) the cervix.  Having to remove the uterus (hysterectomy) if it ruptures. VBAC should not be done if:  The previous cesarean delivery was done with a vertical (classical) or T-shaped incision or you do not know what kind of incision was made.  You had a ruptured uterus.  You have had certain types of surgery on your uterus, such as removal of uterine fibroids. Ask your health care provider about other types of surgeries that prevent you from having a VBAC.  You have certain medical or childbirth (obstetrical) problems.  There are problems with the baby.  You have had two previous cesarean deliveries and no vaginal deliveries. Other facts to know about VBAC:  It   is safe to have an epidural anesthetic with VBAC.  It is safe to turn the baby from a breech position (attempt an external cephalic version).  It is safe to try a VBAC with twins.  VBAC may not be successful if your baby weights 8.8 lb (4 kg) or more. However, weight predictions are not always accurate and should not be used alone to decide if VBAC is right for you.  There is an increased failure rate  if the time between the cesarean delivery and VBAC is less than 19 months.  Your health care provider may advise against a VBAC if you have preeclampsia (high blood pressure, protein in the urine, and swelling of face and extremities).  VBAC is often successful if you previously gave birth vaginally.  VBAC is often successful when the labor starts spontaneously before the due date.  Delivering a baby through a VBAC is similar to having a normal spontaneous vaginal delivery. This information is not intended to replace advice given to you by your health care provider. Make sure you discuss any questions you have with your health care provider. Document Released: 02/24/2007 Document Revised: 02/09/2016 Document Reviewed: 04/02/2013 Elsevier Interactive Patient Education  2018 Elsevier Inc.  

## 2018-06-10 NOTE — Progress Notes (Addendum)
   PRENATAL VISIT NOTE  Subjective:  Erin [name] is a 27 y.o. G3P1101 at 4974w2d being seen today for ongoing prenatal care.  She is currently monitored for the following issues for this low-risk pregnancy and has Rh negative status during pregnancy; S/P cesarean section; Family history of unexplained neonatal death; and Supervision of other normal pregnancy, antepartum on their problem list.  Patient reports no complaints.  Contractions: Not present. Vag. Bleeding: None.  Movement: Present. Denies leaking of fluid.   The following portions of the patient's history were reviewed and updated as appropriate: allergies, current medications, past family history, past medical history, past social history, past surgical history and problem list. Problem list updated.  Objective:   Vitals:   06/10/18 1425  BP: 123/79  Pulse: 83  Weight: 206 lb 12.8 oz (93.8 kg)    Fetal Status: Fetal Heart Rate (bpm): 143 Fundal Height: 20 cm Movement: Present     General:  Alert, oriented and cooperative. Patient is in no acute distress.  Skin: Skin is warm and dry. No rash noted.   Cardiovascular: Normal heart rate noted  Respiratory: Normal respiratory effort, no problems with respiration noted  Abdomen: Soft, gravid, appropriate for gestational age.  Pain/Pressure: Absent     Pelvic: Cervical exam deferred        Extremities: Normal range of motion.  Edema: None  Mental Status: Normal mood and affect. Normal behavior. Normal judgment and thought content.   Assessment and Plan:  Pregnancy: G3P1101 at 2374w2d  1. Supervision of other normal pregnancy, antepartum  - Flu Vaccine QUAD 36+ mos IM  2. Rh negative status during pregnancy in second trimester -Rhogam planned for 28 weeks.   3. Family history of unexplained neonatal death -Due to multiple fetal anomalies; 2nd pregnancy was uneventful.   4. S/P cesarean section Patient states that [facility]  go until 41 weeks, and then, if not in labor, she would have to do a C/section.  Confirmed she is not a waterbirth candidate due to need for monitoring as a VBAC.   Preterm labor symptoms and general obstetric precautions including but not limited to vaginal bleeding, contractions, leaking of fluid and fetal movement were reviewed in detail with the patient. Please refer to After Visit Summary for other counseling recommendations.  Return in about 4 weeks (around 07/08/2018), or LROB.  Future Appointments  Date Time Provider Department Center  07/08/2018  3:35 PM Marylene LandKooistra, Adja Ruff Lorraine, CNM [facility]    Charlesetta GaribaldiKathryn Lorraine [place], [place]

## 2018-07-08 ENCOUNTER — Ambulatory Visit (INDEPENDENT_AMBULATORY_CARE_PROVIDER_SITE_OTHER): Payer: BLUE CROSS/BLUE SHIELD | Admitting: Student

## 2018-07-08 VITALS — BP 133/77 | HR 87 | Wt 215.0 lb

## 2018-07-08 DIAGNOSIS — Z3480 Encounter for supervision of other normal pregnancy, unspecified trimester: Secondary | ICD-10-CM

## 2018-07-08 DIAGNOSIS — Z98891 History of uterine scar from previous surgery: Secondary | ICD-10-CM

## 2018-07-08 NOTE — Patient Instructions (Addendum)

## 2018-07-08 NOTE — Progress Notes (Signed)
   PRENATAL VISIT NOTE  Subjective:  Erin [name] is a 27 y.o. G3P1101 at [redacted]w[redacted]d being seen today for ongoing prenatal care.  She is currently monitored for the following issues for this low-risk pregnancy and has Rh negative status during pregnancy; S/P cesarean section; Family history of unexplained neonatal death; and Supervision of other normal pregnancy, antepartum on their problem list.  Patient reports occasional BH contractions; feels very weak and wants her iron levels checked. .  Contractions: Irritability. Vag. Bleeding: None.  Movement: Present. Denies leaking of fluid.   The following portions of the patient's history were reviewed and updated as appropriate: allergies, current medications, past family history, past medical history, past social history, past surgical history and problem list. Problem list updated.  Objective:   Vitals:   07/08/18 1546  BP: 133/77  Pulse: 87  Weight: 215 lb (97.5 kg)    Fetal Status: Fetal Heart Rate (bpm): 154 Fundal Height: 25 cm Movement: Present     General:  Alert, oriented and cooperative. Patient is in no acute distress.  Skin: Skin is warm and dry. No rash noted.   Cardiovascular: Normal heart rate noted  Respiratory: Normal respiratory effort, no problems with respiration noted  Abdomen: Soft, gravid, appropriate for gestational age.  Pain/Pressure: Absent     Pelvic: Cervical exam deferred        Extremities: Normal range of motion.  Edema: None  Mental Status: Normal mood and affect. Normal behavior. Normal judgment and thought content.   Assessment and Plan:  Pregnancy: G3P1101 at [redacted]w[redacted]d  1. Supervision of other normal pregnancy, antepartum  - CBC - Iron, TIBC and Ferritin Panel  2. S/P cesarean section -Signed TOLAC today; knows that we cannot induce her.   Preterm labor symptoms and general obstetric precautions including but not limited to vaginal bleeding, contractions, leaking of fluid and fetal movement  were reviewed in detail with the patient. Please refer to After Visit Summary for other counseling recommendations.  Return in about 4 weeks (around 08/05/2018), or 2 hour and LROB nv.  Future Appointments  Date Time Provider Department Center  08/05/2018  8:20 AM [facility]  08/05/2018  9:35 AM Crisoforo Oxford, Charlesetta Garibaldi, CNM [facility]    Marylene Land, [place]

## 2018-07-09 ENCOUNTER — Encounter: Payer: Self-pay | Admitting: Student

## 2018-07-09 DIAGNOSIS — D649 Anemia, unspecified: Secondary | ICD-10-CM | POA: Insufficient documentation

## 2018-07-09 LAB — CBC
HEMATOCRIT: 32.6 % — AB (ref 34.0–46.6)
Hemoglobin: 10.6 g/dL — ABNORMAL LOW (ref 11.1–15.9)
MCH: 27.2 pg (ref 26.6–33.0)
MCHC: 32.5 g/dL (ref 31.5–35.7)
MCV: 84 fL (ref 79–97)
Platelets: 328 10*3/uL (ref 150–450)
RBC: 3.9 x10E6/uL (ref 3.77–5.28)
RDW: 14.1 % (ref 12.3–15.4)
WBC: 11.5 10*3/uL — ABNORMAL HIGH (ref 3.4–10.8)

## 2018-07-09 LAB — IRON,TIBC AND FERRITIN PANEL
FERRITIN: 5 ng/mL — AB (ref 15–150)
Iron Saturation: 7 % — CL (ref 15–55)
Iron: 27 ug/dL (ref 27–159)
Total Iron Binding Capacity: 402 ug/dL (ref 250–450)
UIBC: 375 ug/dL (ref 131–425)

## 2018-07-14 ENCOUNTER — Encounter: Payer: Self-pay | Admitting: *Deleted

## 2018-07-15 ENCOUNTER — Other Ambulatory Visit: Payer: Self-pay | Admitting: Student

## 2018-08-04 ENCOUNTER — Other Ambulatory Visit: Payer: Self-pay | Admitting: *Deleted

## 2018-08-04 DIAGNOSIS — Z3480 Encounter for supervision of other normal pregnancy, unspecified trimester: Secondary | ICD-10-CM

## 2018-08-05 ENCOUNTER — Other Ambulatory Visit: Payer: BLUE CROSS/BLUE SHIELD

## 2018-08-05 ENCOUNTER — Ambulatory Visit (INDEPENDENT_AMBULATORY_CARE_PROVIDER_SITE_OTHER): Payer: BLUE CROSS/BLUE SHIELD | Admitting: Student

## 2018-08-05 ENCOUNTER — Encounter: Payer: BLUE CROSS/BLUE SHIELD | Admitting: Student

## 2018-08-05 VITALS — BP 125/70 | HR 85 | Wt 225.1 lb

## 2018-08-05 DIAGNOSIS — Z6791 Unspecified blood type, Rh negative: Secondary | ICD-10-CM

## 2018-08-05 DIAGNOSIS — B9689 Other specified bacterial agents as the cause of diseases classified elsewhere: Secondary | ICD-10-CM | POA: Diagnosis not present

## 2018-08-05 DIAGNOSIS — N760 Acute vaginitis: Secondary | ICD-10-CM | POA: Diagnosis not present

## 2018-08-05 DIAGNOSIS — Z3A28 28 weeks gestation of pregnancy: Secondary | ICD-10-CM

## 2018-08-05 DIAGNOSIS — Z3480 Encounter for supervision of other normal pregnancy, unspecified trimester: Secondary | ICD-10-CM

## 2018-08-05 DIAGNOSIS — N898 Other specified noninflammatory disorders of vagina: Secondary | ICD-10-CM | POA: Diagnosis not present

## 2018-08-05 DIAGNOSIS — Z113 Encounter for screening for infections with a predominantly sexual mode of transmission: Secondary | ICD-10-CM | POA: Diagnosis not present

## 2018-08-05 DIAGNOSIS — B373 Candidiasis of vulva and vagina: Secondary | ICD-10-CM | POA: Diagnosis not present

## 2018-08-05 DIAGNOSIS — O26892 Other specified pregnancy related conditions, second trimester: Secondary | ICD-10-CM

## 2018-08-05 DIAGNOSIS — Z23 Encounter for immunization: Secondary | ICD-10-CM | POA: Diagnosis not present

## 2018-08-05 DIAGNOSIS — O26893 Other specified pregnancy related conditions, third trimester: Secondary | ICD-10-CM

## 2018-08-05 MED ORDER — RHO D IMMUNE GLOBULIN 1500 UNIT/2ML IJ SOSY
300.0000 ug | PREFILLED_SYRINGE | Freq: Once | INTRAMUSCULAR | Status: AC
  Administered 2018-08-05: 300 ug via INTRAMUSCULAR

## 2018-08-05 NOTE — Progress Notes (Signed)
Pt states after last visit started to have d/c with odor but no irritation.

## 2018-08-05 NOTE — Progress Notes (Signed)
Patient ID: Erin [name], female   DOB: 03-18-1991, 27 y.o.   MRN: [id number]   PRENATAL VISIT NOTE  Subjective:  Erin [name] is a 27 y.o. G3P1101 at 6973w2d being seen today for ongoing prenatal care.  She is currently monitored for the following issues for this low-risk pregnancy and has Rh negative status during pregnancy; S/P cesarean section; Family history of unexplained neonatal death; Supervision of other normal pregnancy, antepartum; and Anemia on their problem list.  Patient reports watery discharge and at times thicker and white. Now with odor that smells musky/salty. No itching.  Contractions: Not present. Vag. Bleeding: None.  Movement: Present. Denies leaking of fluid.   The following portions of the patient's history were reviewed and updated as appropriate: allergies, current medications, past family history, past medical history, past social history, past surgical history and problem list. Problem list updated.  Objective:   Vitals:   08/05/18 0927  BP: 125/70  Pulse: 85  Weight: 225 lb 1.6 oz (102.1 kg)    Fetal Status: Fetal Heart Rate (bpm): 141 Fundal Height: 28 cm Movement: Present     General:  Alert, oriented and cooperative. Patient is in no acute distress.  Skin: Skin is warm and dry. No rash noted.   Cardiovascular: Normal heart rate noted  Respiratory: Normal respiratory effort, no problems with respiration noted  Abdomen: Soft, gravid, appropriate for gestational age.  Pain/Pressure: Absent     Pelvic: Cervical exam deferred        Extremities: Normal range of motion.  Edema: Trace  Mental Status: Normal mood and affect. Normal behavior. Normal judgment and thought content.   Assessment and Plan:  Pregnancy: G3P1101 at 8173w2d  1. Discharge from the vagina  - Cervicovaginal ancillary only  2. Rh negative status during pregnancy in second trimester  - rho (d) immune globulin (RHIG/RHOPHYLAC) injection 300 mcg  3. Supervision of  other normal pregnancy, antepartum -Patient still undecided about VBAC vs. C-section; long discussion; patient will keep thinking. If she has c-section she wants her tubes tied.  - Tdap vaccine greater than or equal to 7yo IM  Preterm labor symptoms and general obstetric precautions including but not limited to vaginal bleeding, contractions, leaking of fluid and fetal movement were reviewed in detail with the patient. Please refer to After Visit Summary for other counseling recommendations.  Return in about 2 weeks (around 08/19/2018), or [facility] in two weeks on wed and then in two more weeks with Dr. Shawnie PonsPratt.  Future Appointments  Date Time Provider Department Center  08/20/2018  8:55 AM Marvetta GibbonsBurleson, Brand Maleserri L, NP [facility]  09/03/2018  3:15 PM Reva BoresPratt, Tanya S, [facility] [address]    Harout Scheurich Lorraine PhiloKooistra, [place]

## 2018-08-05 NOTE — Patient Instructions (Signed)
Postpartum Tubal Ligation Postpartum tubal ligation (PPTL) is a procedure to close the fallopian tubes. This is done so that you cannot get pregnant. When the fallopian tubes are closed, the eggs that the ovaries release cannot enter the uterus, and sperm cannot reach the eggs. PPTL is done right after childbirth or 1-2 days after childbirth, before the uterus returns to its normal location. PPTL is sometimes called "getting your tubes tied." You should not have this procedure if you want to get pregnant someday or if you are unsure about having more children. Tell a health care provider about:  Any allergies you have.  All medicines you are taking, including vitamins, herbs, eye drops, creams, and over-the-counter medicines.  Previous problems you or members of your family have had with the use of anesthetics.  Any blood disorders you have.  Previous surgeries you have had.  Any medical conditions you may have.  Any past pregnancies. What are the risks? Generally, this is a safe procedure. However, problems may occur, including:  Infection.  Bleeding.  Injury to surrounding organs.  Side effects from anesthetics.  Failure of the procedure.  This procedure can increase your risk of a kind of pregnancy in which a fertilized egg attaches to the outside of the uterus (ectopic pregnancy). What happens before the procedure?  Ask your health care provider about: ? How much pain you can expect to have. ? What medicines you will be given for pain, especially if you are planning to breastfeed.  Follow instructions from your health care provider about eating and drinking restrictions. What happens during the procedure? If you had a vaginal delivery:  You may be given one or more of the following: ? A medicine that helps you relax (sedative). ? A medicine to numb the area (local anesthetic). ? A medicine to make you fall asleep (general anesthetic). ? A medicine that is injected  into an area of your body to numb everything below the injection site (regional anesthetic).  If you have been given a general anesthetic, a tube will be put down your throat to help you breathe.  An IV tube will be inserted into one of your veins to give you medicines and fluids during the procedure.  Your bladder may be emptied with a small tube (catheter).  An incision will be made just below your belly button.  Your fallopian tubes will be located and brought up through the incision.  Your fallopian tubes will be tied off, burned (cauterized), or blocked with a clip, ring, or clamp. A small portion in the center of each fallopian tube may be removed.  The incision will be closed with stitches (sutures).  A bandage (dressing) will be placed over the incision.  If you had a cesarean delivery:  Tubal ligation will be done through the incision that was used for the cesarean delivery of your baby.  The incision will be closed with sutures.  A dressing will be placed over the incision.  The procedure may vary among health care providers and hospitals. What happens after the procedure?  Your blood pressure, heart rate, breathing rate, and blood oxygen level will be monitored often until the medicines you were given have worn off.  You will be given pain medicine as needed.  Do not drive for 24 hours if you received a sedative. This information is not intended to replace advice given to you by your health care provider. Make sure you discuss any questions you have with your health   care provider. Document Released: 09/03/2005 Document Revised: 02/06/2016 Document Reviewed: 08/14/2015 Elsevier Interactive Patient Education  2018 Elsevier Inc.  

## 2018-08-06 LAB — RPR: RPR: NONREACTIVE

## 2018-08-06 LAB — CERVICOVAGINAL ANCILLARY ONLY
Bacterial vaginitis: POSITIVE — AB
Candida vaginitis: POSITIVE — AB
Chlamydia: NEGATIVE
Neisseria Gonorrhea: NEGATIVE
Trichomonas: NEGATIVE

## 2018-08-06 LAB — CBC
Hematocrit: 32.3 % — ABNORMAL LOW (ref 34.0–46.6)
Hemoglobin: 10.2 g/dL — ABNORMAL LOW (ref 11.1–15.9)
MCH: 26.4 pg — AB (ref 26.6–33.0)
MCHC: 31.6 g/dL (ref 31.5–35.7)
MCV: 84 fL (ref 79–97)
PLATELETS: 288 10*3/uL (ref 150–450)
RBC: 3.86 x10E6/uL (ref 3.77–5.28)
RDW: 13.7 % (ref 12.3–15.4)
WBC: 8.5 10*3/uL (ref 3.4–10.8)

## 2018-08-06 LAB — GLUCOSE TOLERANCE, 2 HOURS W/ 1HR
GLUCOSE, 1 HOUR: 124 mg/dL (ref 65–179)
GLUCOSE, FASTING: 77 mg/dL (ref 65–91)
Glucose, 2 hour: 51 mg/dL — ABNORMAL LOW (ref 65–152)

## 2018-08-06 LAB — HIV ANTIBODY (ROUTINE TESTING W REFLEX): HIV SCREEN 4TH GENERATION: NONREACTIVE

## 2018-08-07 ENCOUNTER — Other Ambulatory Visit: Payer: Self-pay | Admitting: Student

## 2018-08-07 ENCOUNTER — Telehealth: Payer: Self-pay | Admitting: Student

## 2018-08-07 MED ORDER — METRONIDAZOLE 500 MG PO TABS: 500.0000 mg | ORAL_TABLET | Freq: Two times a day (BID) | ORAL | 0 refills | Status: DC

## 2018-08-07 MED ORDER — TERCONAZOLE 0.4 % VA CREA: 1.0000 | TOPICAL_CREAM | Freq: Every day | VAGINAL | 0 refills | Status: DC

## 2018-08-07 NOTE — Telephone Encounter (Signed)
Called patient and left vmail about yeast and BV. Informed her that meds were at the pharmacy and instructed her in how to take. Left contact number if she has any questions.

## 2018-08-20 ENCOUNTER — Ambulatory Visit (INDEPENDENT_AMBULATORY_CARE_PROVIDER_SITE_OTHER): Payer: BLUE CROSS/BLUE SHIELD | Admitting: Nurse Practitioner

## 2018-08-20 VITALS — BP 123/84 | HR 87 | Wt 227.9 lb

## 2018-08-20 DIAGNOSIS — B373 Candidiasis of vulva and vagina: Secondary | ICD-10-CM

## 2018-08-20 DIAGNOSIS — Z3480 Encounter for supervision of other normal pregnancy, unspecified trimester: Secondary | ICD-10-CM

## 2018-08-20 DIAGNOSIS — Z3A30 30 weeks gestation of pregnancy: Secondary | ICD-10-CM

## 2018-08-20 DIAGNOSIS — B3731 Acute candidiasis of vulva and vagina: Secondary | ICD-10-CM

## 2018-08-20 DIAGNOSIS — Z3483 Encounter for supervision of other normal pregnancy, third trimester: Secondary | ICD-10-CM

## 2018-08-20 NOTE — Patient Instructions (Signed)

## 2018-08-20 NOTE — Progress Notes (Signed)
    Subjective:  Erin RowerChristina A [name] is a 27 y.o. G3P1101 at 5429w3d being seen today for ongoing prenatal care.  She is currently monitored for the following issues for this low-risk pregnancy and has Rh negative status during pregnancy; S/P cesarean section; Family history of unexplained neonatal death; Supervision of other normal pregnancy, antepartum; and Anemia on their problem list.  Patient reports vaginal pain and unable to use applicator for yeast cream or have sex.  Contractions: Irritability. Vag. Bleeding: None.  Movement: Present. Denies leaking of fluid.   The following portions of the patient's history were reviewed and updated as appropriate: allergies, current medications, past family history, past medical history, past social history, past surgical history and problem list. Problem list updated.  Objective:   Vitals:   08/20/18 1017  BP: 123/84  Pulse: 87  Weight: 227 lb 14.4 oz (103.4 kg)    Fetal Status: Fetal Heart Rate (bpm): 143 Fundal Height: 33 cm Movement: Present     General:  Alert, oriented and cooperative. Patient is in no acute distress.  Skin: Skin is warm and dry. No rash noted.   Cardiovascular: Normal heart rate noted  Respiratory: Normal respiratory effort, no problems with respiration noted  Abdomen: Soft, gravid, appropriate for gestational age. Pain/Pressure: Present     Pelvic:  Cervical exam deferred      Inspection done of vagina.  Redness of internal tissue noted, minimal to no vaginal discharge.  No ulcers, no blisters, slightly edematous tissue bilaterally at introitus; GC/Chlam, BV, and yeast swab done  Extremities: Normal range of motion.  Edema: Trace  Mental Status: Normal mood and affect. Normal behavior. Normal judgment and thought content.   Urinalysis:      Assessment and Plan:  Pregnancy: G3P1101 at 229w3d  1. Supervision of other normal pregnancy, antepartum Advised to attend childbirth classes  2. Yeast infection involving  the vagina and surrounding area Possible yeast infection causing vaginal pain.  Advised to use Terazol externally and on the tender areas manually and see if there is improvement.  No intercourse until healed.  Preterm labor symptoms and general obstetric precautions including but not limited to vaginal bleeding, contractions, leaking of fluid and fetal movement were reviewed in detail with the patient. Please refer to After Visit Summary for other counseling recommendations.  Return in about 2 weeks (around 09/03/2018).  Nolene BernheimERRI Kalid Ghan, RN, MSN, NP-BC Nurse Practitioner, [facility], [facility] 08/20/2018 10:33 AM

## 2018-09-01 ENCOUNTER — Encounter (HOSPITAL_COMMUNITY): Payer: Self-pay | Admitting: *Deleted

## 2018-09-01 ENCOUNTER — Inpatient Hospital Stay (HOSPITAL_COMMUNITY)
Admission: AD | Admit: 2018-09-01 | Discharge: 2018-09-01 | Disposition: A | Discharge status: Home / Self Care | Payer: BLUE CROSS/BLUE SHIELD | Attending: Obstetrics and Gynecology | Admitting: Obstetrics and Gynecology

## 2018-09-01 DIAGNOSIS — O26893 Other specified pregnancy related conditions, third trimester: Secondary | ICD-10-CM | POA: Insufficient documentation

## 2018-09-01 DIAGNOSIS — W010XXA Fall on same level from slipping, tripping and stumbling without subsequent striking against object, initial encounter: Secondary | ICD-10-CM | POA: Insufficient documentation

## 2018-09-01 DIAGNOSIS — Z3A32 32 weeks gestation of pregnancy: Secondary | ICD-10-CM | POA: Diagnosis not present

## 2018-09-01 DIAGNOSIS — O9A213 Injury, poisoning and certain other consequences of external causes complicating pregnancy, third trimester: Secondary | ICD-10-CM | POA: Diagnosis not present

## 2018-09-01 LAB — URINALYSIS, ROUTINE W REFLEX MICROSCOPIC
Bilirubin Urine: NEGATIVE
Glucose, UA: NEGATIVE mg/dL
Hgb urine dipstick: NEGATIVE
Ketones, ur: NEGATIVE mg/dL
LEUKOCYTES UA: NEGATIVE
Nitrite: NEGATIVE
PROTEIN: NEGATIVE mg/dL
pH: 6.5 (ref 5.0–8.0)

## 2018-09-01 LAB — KLEIHAUER-BETKE STAIN
# VIALS RHIG: 1
FETAL CELLS %: 0 %
Quantitation Fetal Hemoglobin: 0 mL

## 2018-09-01 NOTE — MAU Provider Note (Signed)
  History     CSN: [id number]  Arrival date and time: 09/01/18 0907   First Provider Initiated Contact with Patient 09/01/18 [phone]      Chief Complaint  Patient presents with  . Fall  . Abdominal Pain   HPI This is a 27yo G3P1101 who is currently 2866w1d who presents after a mechanical fall last night around 2130. She states that she tripped over her child's toy last night and landed on the left side of her abdomen. She did not hit her head or lose consciousness. She reports positive fetal movements since the fall. She denies vaginal bleeding, leakage of fluid, or vaginal discharge. She states that she has had Braxton Hicks ctx since she was 19w gestation and is currently feeling abdominal tightness but she denies any regular ctx. She states that after the fall she has been experiencing some pelvic cramps which feel like period cramps. She has not tried anything for relief.    Past Medical History:  Diagnosis Date  . Medical history non-contributory     Past Surgical History:  Procedure Laterality Date  . CESAREAN SECTION    . CESAREAN SECTION N/A 07/26/2016   Procedure: CESAREAN SECTION;  Surgeon: Adam PhenixJames G Arnold, MD;  [facility];  Service: Obstetrics;  Laterality: N/A;  . egg donor x2       Family History  Problem Relation Age of Onset  . Cancer Mother   . Hypertension Father   . Cancer Father     Social History   Tobacco Use  . Smoking status: Never Smoker  . Smokeless tobacco: Never Used  Substance Use Topics  . Alcohol use: No  . Drug use: No    Allergies:  Allergies  Allergen Reactions  . Sulfa Antibiotics Rash    Medications Prior to Admission  Medication Sig Dispense Refill Last Dose  . Prenatal Multivit-Min-Fe-FA (PRENATAL VITAMINS) 0.8 MG tablet Take 1 tablet by mouth daily. 90 tablet 3 Taking  . terconazole (TERAZOL 7) 0.4 % vaginal cream Place 1 applicator vaginally at bedtime. (Patient not taking: Reported on 08/20/2018) 45 g 0 Not  Taking    Review of Systems  Genitourinary: Positive for pelvic pain (cramping). Negative for difficulty urinating, hematuria, vaginal bleeding and vaginal discharge.  Neurological: Negative for dizziness and light-headedness.   Physical Exam   Blood pressure 128/74, pulse (!) 104, temperature 97.8 F (36.6 C), temperature source Oral, resp. rate 18, height 5\' 8"  (1.727 m), weight 104.8 kg, last menstrual period 01/19/2018.  Physical Exam  Vitals reviewed. Constitutional: She is oriented to person, place, and time. She appears well-developed and well-nourished. No distress.  HENT:  Head: Normocephalic and atraumatic.  Cardiovascular: Normal rate and regular rhythm.  Respiratory: Effort normal.  GI: Soft. There is abdominal tenderness (Minimal tenderness to palpation diffusely). There is no rebound and no guarding.  FH appropriate  Neurological: She is alert and oriented to person, place, and time.  Skin: Skin is warm and dry.  No bruising or ecchymosis noted to abdomen  Psychiatric: She has a normal mood and affect. Her behavior is normal.    MAU Course  Procedures  MDM  Pt presents approx 12 hours after mechanical fall, landing on L side of abdomen. Positive fetal movement. No vaginal bleeding, leakage of fluid, or ctx. FHR tracing reassuring.   Orders: - KB  - U/A  Assessment and Plan  1. Fall during third trimester pregnancy   Cecelia ByarsErin Brentin Shin, PA-S. 09/01/2018, 12:48 PM

## 2018-09-01 NOTE — MAU Provider Note (Signed)
Chief Complaint:  Fall and Abdominal Pain   First Provider Initiated Contact with Patient 09/01/18 [phone]     HPI: Erin [name] is a 27 y.o. G3P1101 at 5025w1d who presents to maternity admissions reporting abdominal pain after a fall. Reports tripping over her child's toy & landing on her left side. Incident occurred last night at 2130. Reports pelvic cramping and lower abdominal pressure since fall. Denies vaginal bleeding or LOF. Normal fetal movement.   Location: abdomen Quality: cramping, sore Severity: 6/10 in pain scale Duration: 12 hours Timing: constant Modifying factors: none Associated signs and symptoms: none  Past Medical History:  Diagnosis Date  . Medical history non-contributory    OB History  Gravida Para Term Preterm AB Living  3 2 1 1  0 1  SAB TAB Ectopic Multiple Live Births  0 0 0 0 1    # Outcome Date GA Lbr Len/2nd Weight Sex Delivery Anes PTL Lv  3 Current           2 Term 07/26/16 6437w1d  4215 g M CS-LTranv Spinal  LIV     Birth Comments: normal female infant  1 Preterm 04/08/14 176w4d  2211 g M CS-LTranv Gen  ND     Complications: Antepartum placental abruption    Obstetric Comments  G1: 36wk stat pLTCS @ [facility] (VB, ?abruption, oligo bradycardia and multiple fetal anomalies). Demised neonate.    Past Surgical History:  Procedure Laterality Date  . CESAREAN SECTION    . CESAREAN SECTION N/A 07/26/2016   Procedure: CESAREAN SECTION;  Surgeon: Adam PhenixJames G Arnold, MD;  [facility];  Service: Obstetrics;  Laterality: N/A;  . egg donor x2      Family History  Problem Relation Age of Onset  . Cancer Mother   . Hypertension Father   . Cancer Father    Social History   Tobacco Use  . Smoking status: Never Smoker  . Smokeless tobacco: Never Used  Substance Use Topics  . Alcohol use: No  . Drug use: No   Allergies  Allergen Reactions  . Sulfa Antibiotics Rash   Medications Prior to Admission  Medication Sig Dispense Refill  Last Dose  . Prenatal Multivit-Min-Fe-FA (PRENATAL VITAMINS) 0.8 MG tablet Take 1 tablet by mouth daily. 90 tablet 3 08/31/2018 at Unknown time  . terconazole (TERAZOL 7) 0.4 % vaginal cream Place 1 applicator vaginally at bedtime. (Patient not taking: Reported on 08/20/2018) 45 g 0 Not Taking    I have reviewed patient's Past Medical Hx, Surgical Hx, Family Hx, Social Hx, medications and allergies.   ROS:  Review of Systems  Gastrointestinal: Positive for abdominal pain. Negative for constipation, diarrhea, nausea and vomiting.  Genitourinary: Negative.   Musculoskeletal: Negative.     Physical Exam   Patient Vitals for the past 24 hrs:  BP Temp Temp src Pulse Resp Height Weight  09/01/18 0940 128/74 97.8 F (36.6 C) Oral (!) 104 18 - -  09/01/18 0927 - - - - - 5\' 8"  (1.727 m) 104.8 kg    Constitutional: Well-developed, well-nourished female in no acute distress.  Cardiovascular: normal rate & rhythm, no murmur Respiratory: normal effort, lung sounds clear throughout GI: Abd soft, non-tender, gravid appropriate for gestational age. Pos BS x 4 MS: Extremities nontender, no edema, normal ROM Neurologic: Alert and oriented x 4.  GU:      Pelvic: NEFG, physiologic discharge, no blood, cervix clean.   Dilation: Closed Exam by:: Estanislado SpireE. Mahaila Tischer NP  NST:  Baseline: 135 bpm, Variability: Good {> 6 bpm), Accelerations: Reactive, Decelerations: Absent and no contractions   Labs: Results for orders placed or performed during the hospital encounter of 09/01/18 (from the past 24 hour(s))  Kleihauer-Betke stain     Status: None   Collection Time: 09/01/18 10:58 AM  Result Value Ref Range   Fetal Cells % 0 %   Quantitation Fetal Hemoglobin 0 mL   # Vials RhIg 1   Urinalysis, Routine w reflex microscopic     Status: Abnormal   Collection Time: 09/01/18 11:15 AM  Result Value Ref Range   Color, Urine YELLOW YELLOW   APPearance CLEAR CLEAR   Specific Gravity, Urine <1.005 (L) 1.005 - 1.030    pH 6.5 5.0 - 8.0   Glucose, UA NEGATIVE NEGATIVE mg/dL   Hgb urine dipstick NEGATIVE NEGATIVE   Bilirubin Urine NEGATIVE NEGATIVE   Ketones, ur NEGATIVE NEGATIVE mg/dL   Protein, ur NEGATIVE NEGATIVE mg/dL   Nitrite NEGATIVE NEGATIVE   Leukocytes, UA NEGATIVE NEGATIVE    Imaging:  No results found.  MAU Course: Orders Placed This Encounter  Procedures  . Urinalysis, Routine w reflex microscopic  . Kleihauer-Betke stain  . Discharge patient   No orders of the defined types were placed in this encounter.   MDM: Discussed patient with Dr. Alysia Penna. Will get KB & monitor x 4 hours.  Reactive tracing x 4 hours. No contractions. KB negative. Cervix closed/thick  Assessment: 1. Traumatic injury during pregnancy in third trimester   2. [redacted] weeks gestation of pregnancy     Plan: Discharge home in stable condition.  Discussed reasons to return to MAU   Allergies as of 09/01/2018      Reactions   Sulfa Antibiotics Rash      Medication List    STOP taking these medications   terconazole 0.4 % vaginal cream Commonly known as:  TERAZOL 7     TAKE these medications   Prenatal Vitamins 0.8 MG tablet Take 1 tablet by mouth daily.       Judeth Horn, NP 09/01/2018 2:01 PM

## 2018-09-01 NOTE — Discharge Instructions (Signed)
Preventing Injuries During Pregnancy °Injuries can happen during pregnancy. Minor falls and accidents usually do not harm you or your baby. But some injuries can harm you and your baby. Tell your doctor about any injury you suffer. °What can I do to avoid injuries? °Safety °· Remove rugs and loose objects on the floor. °· Wear comfortable shoes that have a good grip. Do not wear shoes that have high heels. °· Always wear your seat belt in the car. The lap belt should be below your belly. Always drive safely. °· Do not ride on a motorcycle. °Activity °· Do not take part in rough and violent activities or sports. °· Avoid: °? Walking on wet or slippery floors. °? Lifting heavy pots of boiling or hot liquids. °? Fixing electrical problems. °? Being near fires. °General instructions °· Take over-the-counter and prescription medicines only as told by your doctor. °· Know your blood type and the blood type of the baby's father. °· If you are a victim of domestic violence: °? Call your local emergency services (911 in the [place] °? Contact the National Domestic Violence Hotline for help and support. °Get help right away if: °· You fall on your belly or receive any serious blow to your belly. °· You have a stiff neck or neck pain after a fall or an injury. °· You get a headache or have problems with vision after an injury. °· You do not feel the baby move or the baby is not moving as much as normal. °· You have been a victim of domestic violence or any other kind of attack. °· You have been in a car accident. °· You have bleeding from your vagina. °· Fluid is leaking from your vagina. °· You start to have cramping or pain in your belly (contractions). °· You have very bad pain in your lower back. °· You feel weak or pass out (faint). °· You start to throw up (vomit) after an injury. °· You have been burned. °Summary °· Some injuries that happen during pregnancy can do harm to the baby. °· Tell your doctor about any  injury. °· Take steps to avoid injury. This includes removing rugs and loose objects on the floor. Always wear your seat belt in the car. °· Do not take part in rough and violent activities or sports. °· Get help right away if you have any serious accident or injury. °This information is not intended to replace advice given to you by your health care provider. Make sure you discuss any questions you have with your health care provider. °Document Released: 10/06/2010 Document Revised: 09/12/2016 Document Reviewed: 09/12/2016 °Elsevier Interactive Patient Education © 2017 Elsevier Inc. °Fetal Movement Counts °Patient Name: ________________________________________________ Patient Due Date: ____________________ °What is a fetal movement count? °A fetal movement count is the number of times that you feel your baby move during a certain amount of time. This may also be called a fetal kick count. A fetal movement count is recommended for every pregnant woman. You may be asked to start counting fetal movements as early as week 28 of your pregnancy. °Pay attention to when your baby is most active. You may notice your baby's sleep and wake cycles. You may also notice things that make your baby move more. You should do a fetal movement count: °· When your baby is normally most active. °· At the same time each day. ° °A good time to count movements is while you are resting, after having something to eat   and drink. °How do I count fetal movements? °1. Find a quiet, comfortable area. Sit, or lie down on your side. °2. Write down the date, the start time and stop time, and the number of movements that you felt between those two times. Take this information with you to your health care visits. °3. For 2 hours, count kicks, flutters, swishes, rolls, and jabs. You should feel at least 10 movements during 2 hours. °4. You may stop counting after you have felt 10 movements. °5. If you do not feel 10 movements in 2 hours, have something  to eat and drink. Then, keep resting and counting for 1 hour. If you feel at least 4 movements during that hour, you may stop counting. °Contact a health care provider if: °· You feel fewer than 4 movements in 2 hours. °· Your baby is not moving like he or she usually does. °Date: ____________ Start time: ____________ Stop time: ____________ Movements: ____________ °Date: ____________ Start time: ____________ Stop time: ____________ Movements: ____________ °Date: ____________ Start time: ____________ Stop time: ____________ Movements: ____________ °Date: ____________ Start time: ____________ Stop time: ____________ Movements: ____________ °Date: ____________ Start time: ____________ Stop time: ____________ Movements: ____________ °Date: ____________ Start time: ____________ Stop time: ____________ Movements: ____________ °Date: ____________ Start time: ____________ Stop time: ____________ Movements: ____________ °Date: ____________ Start time: ____________ Stop time: ____________ Movements: ____________ °Date: ____________ Start time: ____________ Stop time: ____________ Movements: ____________ °This information is not intended to replace advice given to you by your health care provider. Make sure you discuss any questions you have with your health care provider. °Document Released: 10/03/2006 Document Revised: 05/02/2016 Document Reviewed: 10/13/2015 °Elsevier Interactive Patient Education © 2018 Elsevier Inc. ° °

## 2018-09-01 NOTE — MAU Note (Signed)
Pt states she fell around 2130 last night, fell on L side of abdomen.  Abdomen is sore, noted cramping starting around 0100.  Denies bleeding or LOF.  Reports good fetal movement.

## 2018-09-03 ENCOUNTER — Ambulatory Visit (INDEPENDENT_AMBULATORY_CARE_PROVIDER_SITE_OTHER): Payer: BLUE CROSS/BLUE SHIELD | Admitting: Family Medicine

## 2018-09-03 VITALS — BP 125/70 | HR 94 | Wt 228.1 lb

## 2018-09-03 DIAGNOSIS — Z3480 Encounter for supervision of other normal pregnancy, unspecified trimester: Secondary | ICD-10-CM

## 2018-09-03 DIAGNOSIS — Z3483 Encounter for supervision of other normal pregnancy, third trimester: Secondary | ICD-10-CM

## 2018-09-03 DIAGNOSIS — R42 Dizziness and giddiness: Secondary | ICD-10-CM

## 2018-09-03 DIAGNOSIS — Z6791 Unspecified blood type, Rh negative: Secondary | ICD-10-CM

## 2018-09-03 DIAGNOSIS — O26892 Other specified pregnancy related conditions, second trimester: Secondary | ICD-10-CM

## 2018-09-03 DIAGNOSIS — O26893 Other specified pregnancy related conditions, third trimester: Secondary | ICD-10-CM

## 2018-09-03 DIAGNOSIS — Z98891 History of uterine scar from previous surgery: Secondary | ICD-10-CM

## 2018-09-03 MED ORDER — MECLIZINE HCL 25 MG PO TABS: 25.0000 mg | ORAL_TABLET | Freq: Three times a day (TID) | ORAL | 0 refills | Status: DC | PRN

## 2018-09-03 NOTE — Patient Instructions (Signed)

## 2018-09-03 NOTE — Progress Notes (Signed)
   PRENATAL VISIT NOTE  Subjective:  Erin [name] is a 27 y.o. G3P1101 at 3286w3d being seen today for ongoing prenatal care.  She is currently monitored for the following issues for this low-risk pregnancy and has Rh negative status during pregnancy; S/P cesarean section; Family history of unexplained neonatal death; Supervision of other normal pregnancy, antepartum; and Anemia on their problem list.  Patient reports room spinning, dizziness, emesis.  Contractions: Not present. Vag. Bleeding: None.  Movement: Present. Denies leaking of fluid.   The following portions of the patient's history were reviewed and updated as appropriate: allergies, current medications, past family history, past medical history, past social history, past surgical history and problem list. Problem list updated.  Objective:   Vitals:   09/03/18 1554  BP: 125/70  Pulse: 94  Weight: 228 lb 1.6 oz (103.5 kg)    Fetal Status: Fetal Heart Rate (bpm): 133 Fundal Height: 32 cm Movement: Present     General:  Alert, oriented and cooperative. Patient is in no acute distress.  Skin: Skin is warm and dry. No rash noted.   Cardiovascular: Normal heart rate noted  Respiratory: Normal respiratory effort, no problems with respiration noted  Abdomen: Soft, gravid, appropriate for gestational age.  Pain/Pressure: Present     Pelvic: Cervical exam deferred        Extremities: Normal range of motion.  Edema: Trace  Mental Status: Normal mood and affect. Normal behavior. Normal judgment and thought content.   Assessment and Plan:  Pregnancy: G3P1101 at 586w3d  1. Supervision of other normal pregnancy, antepartum Continue routine prenatal care.   2. S/P cesarean section Wants to schedule RCS at 40 wks and have RCS and BTL (no Medicaid) if undelivered by that time  3. Rh negative status during pregnancy in second trimester S/p Rhogam  4. Vertigo Advised to YouTube cures for vertigo with head manipulation She  declines. Trial of Anti-vert. - meclizine (ANTIVERT) 25 MG tablet; Take 1 tablet (25 mg total) by mouth 3 (three) times daily as needed for dizziness.  Dispense: 30 tablet; Refill: 0  Preterm labor symptoms and general obstetric precautions including but not limited to vaginal bleeding, contractions, leaking of fluid and fetal movement were reviewed in detail with the patient. Please refer to After Visit Summary for other counseling recommendations.  Return in 2 weeks (on 09/17/2018).  Future Appointments  Date Time Provider Department Center  09/19/2018 10:15 AM Judeth HornLawrence, Erin, NP [facility]    Reva Boresanya S , MD

## 2018-09-04 ENCOUNTER — Encounter (HOSPITAL_COMMUNITY): Payer: Self-pay

## 2018-09-19 ENCOUNTER — Encounter: Payer: Self-pay | Admitting: Student

## 2018-09-19 ENCOUNTER — Encounter (HOSPITAL_COMMUNITY): Payer: Self-pay

## 2018-09-19 ENCOUNTER — Ambulatory Visit (INDEPENDENT_AMBULATORY_CARE_PROVIDER_SITE_OTHER): Payer: BLUE CROSS/BLUE SHIELD | Admitting: Student

## 2018-09-19 VITALS — BP 113/85 | HR 95 | Wt 229.5 lb

## 2018-09-19 DIAGNOSIS — Z98891 History of uterine scar from previous surgery: Secondary | ICD-10-CM

## 2018-09-19 DIAGNOSIS — Z3483 Encounter for supervision of other normal pregnancy, third trimester: Secondary | ICD-10-CM

## 2018-09-19 DIAGNOSIS — Z3480 Encounter for supervision of other normal pregnancy, unspecified trimester: Secondary | ICD-10-CM

## 2018-09-19 NOTE — Progress Notes (Signed)
   PRENATAL VISIT NOTE  Subjective:  Erin [name] is a 28 y.o. G3P1101 at [redacted]w[redacted]d being seen today for ongoing prenatal care.  She is currently monitored for the following issues for this high-risk pregnancy and has Rh negative status during pregnancy; S/P cesarean section; Family history of unexplained neonatal death; Supervision of other normal pregnancy, antepartum; and Anemia on their problem list.  Patient reports no complaints.  Contractions: Not present. Vag. Bleeding: None.  Movement: Present. Denies leaking of fluid.   The following portions of the patient's history were reviewed and updated as appropriate: allergies, current medications, past family history, past medical history, past social history, past surgical history and problem list. Problem list updated.  Objective:   Vitals:   09/19/18 1049  BP: 113/85  Pulse: 95  Weight: 229 lb 8 oz (104.1 kg)    Fetal Status: Fetal Heart Rate (bpm): 144 Fundal Height: 33 cm Movement: Present     General:  Alert, oriented and cooperative. Patient is in no acute distress.  Skin: Skin is warm and dry. No rash noted.   Cardiovascular: Normal heart rate noted  Respiratory: Normal respiratory effort, no problems with respiration noted  Abdomen: Soft, gravid, appropriate for gestational age.  Pain/Pressure: Present     Pelvic: Cervical exam deferred        Extremities: Normal range of motion.  Edema: Trace  Mental Status: Normal mood and affect. Normal behavior. Normal judgment and thought content.   Assessment and Plan:  Pregnancy: G3P1101 at [redacted]w[redacted]d  1. Supervision of other normal pregnancy, antepartum   2. S/P cesarean section -She is scheduled for RCS & BTL at 40 wks. States she initially wanted it at 40 weeks but would now like it done at 39 wks. Plans on TOLAC if goes into labor before scheduled c/section. Message sent to [place] to reschedule surgery.   Preterm labor symptoms and general obstetric precautions including  but not limited to vaginal bleeding, contractions, leaking of fluid and fetal movement were reviewed in detail with the patient. Please refer to After Visit Summary for other counseling recommendations.  Return in about 2 weeks (around 10/03/2018) for Routine OB.  Future Appointments  Date Time Provider Department Center  10/03/2018 10:15 AM [place], [place], CNM [facility]    Judeth Horn, NP

## 2018-09-19 NOTE — Patient Instructions (Signed)
Third Trimester of Pregnancy The third trimester is from week 28 through week 40 (months 7 through 9). The third trimester is a time when the unborn baby (fetus) is growing rapidly. At the end of the ninth month, the fetus is about 20 inches in length and weighs 6-10 pounds. Body changes during your third trimester Your body will continue to go through many changes during pregnancy. The changes vary from woman to woman. During the third trimester:  Your weight will continue to increase. You can expect to gain 25-35 pounds (11-16 kg) by the end of the pregnancy.  You may begin to get stretch marks on your hips, abdomen, and breasts.  You may urinate more often because the fetus is moving lower into your pelvis and pressing on your bladder.  You may develop or continue to have heartburn. This is caused by increased hormones that slow down muscles in the digestive tract.  You may develop or continue to have constipation because increased hormones slow digestion and cause the muscles that push waste through your intestines to relax.  You may develop hemorrhoids. These are swollen veins (varicose veins) in the rectum that can itch or be painful.  You may develop swollen, bulging veins (varicose veins) in your legs.  You may have increased body aches in the pelvis, back, or thighs. This is due to weight gain and increased hormones that are relaxing your joints.  You may have changes in your hair. These can include thickening of your hair, rapid growth, and changes in texture. Some women also have hair loss during or after pregnancy, or hair that feels dry or thin. Your hair will most likely return to normal after your baby is born.  Your breasts will continue to grow and they will continue to become tender. A yellow fluid (colostrum) may leak from your breasts. This is the first milk you are producing for your baby.  Your belly button may stick out.  You may notice more swelling in your hands,  face, or ankles.  You may have increased tingling or numbness in your hands, arms, and legs. The skin on your belly may also feel numb.  You may feel short of breath because of your expanding uterus.  You may have more problems sleeping. This can be caused by the size of your belly, increased need to urinate, and an increase in your body's metabolism.  You may notice the fetus "dropping," or moving lower in your abdomen (lightening).  You may have increased vaginal discharge.  You may notice your joints feel loose and you may have pain around your pelvic bone. What to expect at prenatal visits You will have prenatal exams every 2 weeks until week 36. Then you will have weekly prenatal exams. During a routine prenatal visit:  You will be weighed to make sure you and the baby are growing normally.  Your blood pressure will be taken.  Your abdomen will be measured to track your baby's growth.  The fetal heartbeat will be listened to.  Any test results from the previous visit will be discussed.  You may have a cervical check near your due date to see if your cervix has softened or thinned (effaced).  You will be tested for Group B streptococcus. This happens between 35 and 37 weeks. Your health care provider may ask you:  What your birth plan is.  How you are feeling.  If you are feeling the baby move.  If you have had any abnormal   symptoms, such as leaking fluid, bleeding, severe headaches, or abdominal cramping.  If you are using any tobacco products, including cigarettes, chewing tobacco, and electronic cigarettes.  If you have any questions. Other tests or screenings that may be performed during your third trimester include:  Blood tests that check for low iron levels (anemia).  Fetal testing to check the health, activity level, and growth of the fetus. Testing is done if you have certain medical conditions or if there are problems during the pregnancy.  Nonstress test  (NST). This test checks the health of your baby to make sure there are no signs of problems, such as the baby not getting enough oxygen. During this test, a belt is placed around your belly. The baby is made to move, and its heart rate is monitored during movement. What is false labor? False labor is a condition in which you feel small, irregular tightenings of the muscles in the womb (contractions) that usually go away with rest, changing position, or drinking water. These are called Braxton Hicks contractions. Contractions may last for hours, days, or even weeks before true labor sets in. If contractions come at regular intervals, become more frequent, increase in intensity, or become painful, you should see your health care provider. What are the signs of labor?  Abdominal cramps.  Regular contractions that start at 10 minutes apart and become stronger and more frequent with time.  Contractions that start on the top of the uterus and spread down to the lower abdomen and back.  Increased pelvic pressure and dull back pain.  A watery or bloody mucus discharge that comes from the vagina.  Leaking of amniotic fluid. This is also known as your "water breaking." It could be a slow trickle or a gush. Let your health care provider know if it has a color or strange odor. If you have any of these signs, call your health care provider right away, even if it is before your due date. Follow these instructions at home: Medicines  Follow your health care provider's instructions regarding medicine use. Specific medicines may be either safe or unsafe to take during pregnancy.  Take a prenatal vitamin that contains at least 600 micrograms (mcg) of folic acid.  If you develop constipation, try taking a stool softener if your health care provider approves. Eating and drinking   Eat a balanced diet that includes fresh fruits and vegetables, whole grains, good sources of protein such as meat, eggs, or tofu,  and low-fat dairy. Your health care provider will help you determine the amount of weight gain that is right for you.  Avoid raw meat and uncooked cheese. These carry germs that can cause birth defects in the baby.  If you have low calcium intake from food, talk to your health care provider about whether you should take a daily calcium supplement.  Eat four or five small meals rather than three large meals a day.  Limit foods that are high in fat and processed sugars, such as fried and sweet foods.  To prevent constipation: ? Drink enough fluid to keep your urine clear or pale yellow. ? Eat foods that are high in fiber, such as fresh fruits and vegetables, whole grains, and beans. Activity  Exercise only as directed by your health care provider. Most women can continue their usual exercise routine during pregnancy. Try to exercise for 30 minutes at least 5 days a week. Stop exercising if you experience uterine contractions.  Avoid heavy lifting.  Do   not exercise in extreme heat or humidity, or at high altitudes.  Wear low-heel, comfortable shoes.  Practice good posture.  You may continue to have sex unless your health care provider tells you otherwise. Relieving pain and discomfort  Take frequent breaks and rest with your legs elevated if you have leg cramps or low back pain.  Take warm sitz baths to soothe any pain or discomfort caused by hemorrhoids. Use hemorrhoid cream if your health care provider approves.  Wear a good support bra to prevent discomfort from breast tenderness.  If you develop varicose veins: ? Wear support pantyhose or compression stockings as told by your healthcare provider. ? Elevate your feet for 15 minutes, 3-4 times a day. Prenatal care  Write down your questions. Take them to your prenatal visits.  Keep all your prenatal visits as told by your health care provider. This is important. Safety  Wear your seat belt at all times when driving.  Make  a list of emergency phone numbers, including numbers for family, friends, the hospital, and police and fire departments. General instructions  Avoid cat litter boxes and soil used by cats. These carry germs that can cause birth defects in the baby. If you have a cat, ask someone to clean the litter box for you.  Do not travel far distances unless it is absolutely necessary and only with the approval of your health care provider.  Do not use hot tubs, steam rooms, or saunas.  Do not drink alcohol.  Do not use any products that contain nicotine or tobacco, such as cigarettes and e-cigarettes. If you need help quitting, ask your health care provider.  Do not use any medicinal herbs or unprescribed drugs. These chemicals affect the formation and growth of the baby.  Do not douche or use tampons or scented sanitary pads.  Do not cross your legs for long periods of time.  To prepare for the arrival of your baby: ? Take prenatal classes to understand, practice, and ask questions about labor and delivery. ? Make a trial run to the hospital. ? Visit the hospital and tour the maternity area. ? Arrange for maternity or paternity leave through employers. ? Arrange for family and friends to take care of pets while you are in the hospital. ? Purchase a rear-facing car seat and make sure you know how to install it in your car. ? Pack your hospital bag. ? Prepare the baby's nursery. Make sure to remove all pillows and stuffed animals from the baby's crib to prevent suffocation.  Visit your dentist if you have not gone during your pregnancy. Use a soft toothbrush to brush your teeth and be gentle when you floss. Contact a health care provider if:  You are unsure if you are in labor or if your water has broken.  You become dizzy.  You have mild pelvic cramps, pelvic pressure, or nagging pain in your abdominal area.  You have lower back pain.  You have persistent nausea, vomiting, or  diarrhea.  You have an unusual or bad smelling vaginal discharge.  You have pain when you urinate. Get help right away if:  Your water breaks before 37 weeks.  You have regular contractions less than 5 minutes apart before 37 weeks.  You have a fever.  You are leaking fluid from your vagina.  You have spotting or bleeding from your vagina.  You have severe abdominal pain or cramping.  You have rapid weight loss or weight gain.  You have   shortness of breath with chest pain.  You notice sudden or extreme swelling of your face, hands, ankles, feet, or legs.  Your baby makes fewer than 10 movements in 2 hours.  You have severe headaches that do not go away when you take medicine.  You have vision changes. Summary  The third trimester is from week 28 through week 40, months 7 through 9. The third trimester is a time when the unborn baby (fetus) is growing rapidly.  During the third trimester, your discomfort may increase as you and your baby continue to gain weight. You may have abdominal, leg, and back pain, sleeping problems, and an increased need to urinate.  During the third trimester your breasts will keep growing and they will continue to become tender. A yellow fluid (colostrum) may leak from your breasts. This is the first milk you are producing for your baby.  False labor is a condition in which you feel small, irregular tightenings of the muscles in the womb (contractions) that eventually go away. These are called Braxton Hicks contractions. Contractions may last for hours, days, or even weeks before true labor sets in.  Signs of labor can include: abdominal cramps; regular contractions that start at 10 minutes apart and become stronger and more frequent with time; watery or bloody mucus discharge that comes from the vagina; increased pelvic pressure and dull back pain; and leaking of amniotic fluid. This information is not intended to replace advice given to you by your  health care provider. Make sure you discuss any questions you have with your health care provider. Document Released: 08/28/2001 Document Revised: 10/09/2016 Document Reviewed: 10/09/2016 Elsevier Interactive Patient Education  2019 [employer]  

## 2018-10-03 ENCOUNTER — Ambulatory Visit (INDEPENDENT_AMBULATORY_CARE_PROVIDER_SITE_OTHER): Payer: BLUE CROSS/BLUE SHIELD | Admitting: Advanced Practice Midwife

## 2018-10-03 VITALS — BP 123/85 | HR 92 | Wt 235.1 lb

## 2018-10-03 DIAGNOSIS — Z3480 Encounter for supervision of other normal pregnancy, unspecified trimester: Secondary | ICD-10-CM

## 2018-10-03 DIAGNOSIS — O26893 Other specified pregnancy related conditions, third trimester: Secondary | ICD-10-CM

## 2018-10-03 DIAGNOSIS — Z113 Encounter for screening for infections with a predominantly sexual mode of transmission: Secondary | ICD-10-CM | POA: Diagnosis not present

## 2018-10-03 DIAGNOSIS — Z98891 History of uterine scar from previous surgery: Secondary | ICD-10-CM

## 2018-10-03 DIAGNOSIS — Z3A36 36 weeks gestation of pregnancy: Secondary | ICD-10-CM

## 2018-10-03 DIAGNOSIS — Z3483 Encounter for supervision of other normal pregnancy, third trimester: Secondary | ICD-10-CM

## 2018-10-03 DIAGNOSIS — Z6791 Unspecified blood type, Rh negative: Secondary | ICD-10-CM

## 2018-10-03 NOTE — Progress Notes (Signed)
   PRENATAL VISIT NOTE  Subjective:  Erin [name] is a 28 y.o. G3P1101 at [redacted]w[redacted]d being seen today for ongoing prenatal care.  She is currently monitored for the following issues for this low-risk pregnancy and has Rh negative status during pregnancy; S/P cesarean section; Family history of unexplained neonatal death; Supervision of other normal pregnancy, antepartum; and Anemia on their problem list.  Patient reports vaginal irritation and white vaginal discharge .  Contractions: Not present. Vag. Bleeding: None.  Movement: Present. Denies leaking of fluid.   The following portions of the patient's history were reviewed and updated as appropriate: allergies, current medications, past family history, past medical history, past social history, past surgical history and problem list. Problem list updated.  Objective:   Vitals:   10/03/18 1109  BP: 123/85  Pulse: 92  Weight: 235 lb 1.6 oz (106.6 kg)    Fetal Status: Fetal Heart Rate (bpm): 154   Movement: Present     General:  Alert, oriented and cooperative. Patient is in no acute distress.  Skin: Skin is warm and dry. No rash noted.   Cardiovascular: Normal heart rate noted  Respiratory: Normal respiratory effort, no problems with respiration noted  Abdomen: Soft, gravid, appropriate for gestational age.  Pain/Pressure: Present     Pelvic: Cervical exam performed      closed/thick, mod amount of thick white, odorless discharge   Extremities: Normal range of motion.  Edema: Trace  Mental Status: Normal mood and affect. Normal behavior. Normal judgment and thought content.   Assessment and Plan:  Pregnancy: G3P1101 at [redacted]w[redacted]d  1. Supervision of other normal pregnancy, antepartum  - GC/Chlamydia probe amp ([place])not at [facility] - Culture, beta strep (group b only)  2. Rh negative status during pregnancy in third trimester   3. S/P cesarean section   4. Vaginal discharge c/w VVC - Has Terazol   Term labor symptoms  and general obstetric precautions including but not limited to vaginal bleeding, contractions, leaking of fluid and fetal movement were reviewed in detail with the patient. Please refer to After Visit Summary for other counseling recommendations.  No follow-ups on file.  No future appointments.  Dorathy Kinsman, CNM

## 2018-10-06 LAB — GC/CHLAMYDIA PROBE AMP (~~LOC~~) NOT AT ARMC
Chlamydia: NEGATIVE
NEISSERIA GONORRHEA: NEGATIVE

## 2018-10-07 ENCOUNTER — Telehealth (HOSPITAL_COMMUNITY): Payer: Self-pay | Admitting: *Deleted

## 2018-10-07 ENCOUNTER — Encounter: Payer: Self-pay | Admitting: Advanced Practice Midwife

## 2018-10-07 LAB — CULTURE, BETA STREP (GROUP B ONLY): Strep Gp B Culture: NEGATIVE

## 2018-10-07 NOTE — Telephone Encounter (Signed)
Preadmission screen  

## 2018-10-09 ENCOUNTER — Telehealth (HOSPITAL_COMMUNITY): Payer: Self-pay | Admitting: *Deleted

## 2018-10-09 NOTE — Telephone Encounter (Signed)
Preadmission screen  

## 2018-10-10 ENCOUNTER — Encounter: Payer: Self-pay | Admitting: Nurse Practitioner

## 2018-10-10 ENCOUNTER — Ambulatory Visit (INDEPENDENT_AMBULATORY_CARE_PROVIDER_SITE_OTHER): Payer: BLUE CROSS/BLUE SHIELD | Admitting: Nurse Practitioner

## 2018-10-10 ENCOUNTER — Telehealth (HOSPITAL_COMMUNITY): Payer: Self-pay | Admitting: *Deleted

## 2018-10-10 VITALS — BP 124/74 | HR 82 | Wt 237.6 lb

## 2018-10-10 DIAGNOSIS — Z98891 History of uterine scar from previous surgery: Secondary | ICD-10-CM

## 2018-10-10 DIAGNOSIS — Z3480 Encounter for supervision of other normal pregnancy, unspecified trimester: Secondary | ICD-10-CM

## 2018-10-10 DIAGNOSIS — Z3483 Encounter for supervision of other normal pregnancy, third trimester: Secondary | ICD-10-CM

## 2018-10-10 NOTE — Progress Notes (Signed)
Pt states has been having pelvic & back pain since Wednesday.

## 2018-10-10 NOTE — Telephone Encounter (Signed)
Preadmission screen  

## 2018-10-10 NOTE — Progress Notes (Signed)
    Subjective:  Erin [name] is a 28 y.o. G3P1101 at [redacted]w[redacted]d being seen today for ongoing prenatal care.  She is currently monitored for the following issues for this low-risk pregnancy and has Rh negative status during pregnancy; S/P cesarean section; Family history of unexplained neonatal death; Supervision of other normal pregnancy, antepartum; and Anemia on their problem list.  Patient reports backache.  Contractions: Not present. Vag. Bleeding: None.  Movement: Present. Denies leaking of fluid.   Thinking she may be having back labor and wants to have her cervix checked.  The following portions of the patient's history were reviewed and updated as appropriate: allergies, current medications, past family history, past medical history, past social history, past surgical history and problem list. Problem list updated.  Objective:   Vitals:   10/10/18 1111  BP: 124/74  Pulse: 82  Weight: 237 lb 9.6 oz (107.8 kg)    Fetal Status: Fetal Heart Rate (bpm): 152 Fundal Height: 37 cm Movement: Present  Presentation: Vertex  General:  Alert, oriented and cooperative. Patient is in no acute distress.  Skin: Skin is warm and dry. No rash noted.   Cardiovascular: Normal heart rate noted  Respiratory: Normal respiratory effort, no problems with respiration noted  Abdomen: Soft, gravid, appropriate for gestational age. Pain/Pressure: Present     Pelvic:  Cervical exam performed Dilation: Closed Effacement (%): Thick Station: Ballotable  Extremities: Normal range of motion.  Edema: Trace  Mental Status: Normal mood and affect. Normal behavior. Normal judgment and thought content.   Urinalysis:      Assessment and Plan:  Pregnancy: G3P1101 at [redacted]w[redacted]d  1. Supervision of other normal pregnancy, antepartum Having lots of pressure but baby is not in the pelvis.  Reviewed dancing and pelvic positioning to help the baby come down.  Wants to go into labor to avoid C/S but likely will have C/S  when her scheduled date arrives as she is concerned about how big the baby will be.  Last baby was 9-4.  May try the [employer].  Asked about castor oil but provider discourages that for now.  2. S/P cesarean section Plans TOLAC and has consent on chart.  Also has C/S date scheduled on 10-21-18 in case she has not gone into labor.  Term labor symptoms and general obstetric precautions including but not limited to vaginal bleeding, contractions, leaking of fluid and fetal movement were reviewed in detail with the patient. Please refer to After Visit Summary for other counseling recommendations.  Return in about 1 week (around 10/17/2018).  Nolene Bernheim, RN, MSN, NP-BC Nurse Practitioner, [facility] for [employer], [facility] 10/10/2018 11:28 AM

## 2018-10-10 NOTE — Patient Instructions (Signed)

## 2018-10-13 ENCOUNTER — Encounter (HOSPITAL_COMMUNITY): Payer: Self-pay

## 2018-10-16 ENCOUNTER — Ambulatory Visit (INDEPENDENT_AMBULATORY_CARE_PROVIDER_SITE_OTHER): Payer: BLUE CROSS/BLUE SHIELD | Admitting: Obstetrics and Gynecology

## 2018-10-16 VITALS — BP 134/72 | HR 80 | Wt 236.0 lb

## 2018-10-16 DIAGNOSIS — Z3483 Encounter for supervision of other normal pregnancy, third trimester: Secondary | ICD-10-CM

## 2018-10-16 DIAGNOSIS — Z3480 Encounter for supervision of other normal pregnancy, unspecified trimester: Secondary | ICD-10-CM

## 2018-10-16 DIAGNOSIS — Z98891 History of uterine scar from previous surgery: Secondary | ICD-10-CM

## 2018-10-16 DIAGNOSIS — O26893 Other specified pregnancy related conditions, third trimester: Secondary | ICD-10-CM

## 2018-10-16 DIAGNOSIS — Z6791 Unspecified blood type, Rh negative: Secondary | ICD-10-CM

## 2018-10-16 NOTE — Progress Notes (Signed)
   PRENATAL VISIT NOTE  Subjective:  Erin [name] is a 28 y.o. G3P1101 at [redacted]w[redacted]d being seen today for ongoing prenatal care.  She is currently monitored for the following issues for this low-risk pregnancy and has Rh negative status during pregnancy; S/P cesarean section; Family history of unexplained neonatal death; Supervision of other normal pregnancy, antepartum; and Anemia on their problem list.  Patient reports no complaints.  Contractions: Not present. Vag. Bleeding: None.  Movement: Present. Denies leaking of fluid.   The following portions of the patient's history were reviewed and updated as appropriate: allergies, current medications, past family history, past medical history, past social history, past surgical history and problem list. Problem list updated.  Objective:   Vitals:   10/16/18 1639  BP: 134/72  Pulse: 80  Weight: 236 lb (107 kg)    Fetal Status: Fetal Heart Rate (bpm): 132 Fundal Height: 37 cm Movement: Present     General:  Alert, oriented and cooperative. Patient is in no acute distress.  Skin: Skin is warm and dry. No rash noted.   Cardiovascular: Normal heart rate noted  Respiratory: Normal respiratory effort, no problems with respiration noted  Abdomen: Soft, gravid, appropriate for gestational age.  Pain/Pressure: Present     Pelvic: Cervical exam deferred        Extremities: Normal range of motion.  Edema: Trace  Mental Status: Normal mood and affect. Normal behavior. Normal judgment and thought content.   Assessment and Plan:  Pregnancy: G3P1101 at [redacted]w[redacted]d  1. Supervision of other normal pregnancy, antepartum  GBS negative   2. S/P cesarean section  Scheduled   3. Rh negative status during pregnancy in third trimester  Received at 28 weeks   There are no diagnoses linked to this encounter. Preterm labor symptoms and general obstetric precautions including but not limited to vaginal bleeding, contractions, leaking of fluid and fetal  movement were reviewed in detail with the patient. Please refer to After Visit Summary for other counseling recommendations.  No follow-ups on file.  Future Appointments  Date Time Provider Department Center  10/21/2018  9:15 AM [facility] PAT 5 [facility] None    Venia Carbon, NP

## 2018-10-21 ENCOUNTER — Encounter (HOSPITAL_COMMUNITY)
Admission: RE | Admit: 2018-10-21 | Discharge: 2018-10-21 | Disposition: A | Discharge status: Home / Self Care | Payer: BLUE CROSS/BLUE SHIELD | Source: Ambulatory Visit | Attending: Family Medicine | Admitting: Family Medicine

## 2018-10-21 DIAGNOSIS — Z01812 Encounter for preprocedural laboratory examination: Secondary | ICD-10-CM

## 2018-10-21 LAB — CBC
HCT: 34.6 % — ABNORMAL LOW (ref 36.0–46.0)
HEMOGLOBIN: 10.7 g/dL — AB (ref 12.0–15.0)
MCH: 24.2 pg — ABNORMAL LOW (ref 26.0–34.0)
MCHC: 30.9 g/dL (ref 30.0–36.0)
MCV: 78.1 fL — ABNORMAL LOW (ref 80.0–100.0)
Platelets: 261 10*3/uL (ref 150–400)
RBC: 4.43 MIL/uL (ref 3.87–5.11)
RDW: 15.3 % (ref 11.5–15.5)
WBC: 9.5 10*3/uL (ref 4.0–10.5)
nRBC: 0 % (ref 0.0–0.2)

## 2018-10-21 NOTE — Patient Instructions (Signed)
Erin [name]  10/21/2018   Your procedure is scheduled on:  10/22/2018  Enter through the Main Entrance of [facility] at 0730 AM.  Pick up the phone at the desk and dial [phone]  Call this number if you have problems the morning of [phone]  Remember:   Do not eat food:(After Midnight) Desps de medianoche.  Do not drink clear liquids: (After Midnight) Desps de medianoche.  Take these medicines the morning of surgery with A SIP OF WATER: none   Do not wear jewelry, make-up or nail polish.  Do not wear lotions, powders, or perfumes. Do not wear deodorant.  Do not shave 48 hours prior to surgery.  Do not bring valuables to the hospital.  [facility] is not   responsible for any belongings or valuables brought to the hospital.  Contacts, dentures or bridgework may not be worn into surgery.  Leave suitcase in the car. After surgery it may be brought to your room.  For patients admitted to the hospital, checkout time is 11:00 AM the day of              discharge.    N/A   Please read over the following fact sheets that you were given:   Surgical Site Infection Prevention

## 2018-10-22 ENCOUNTER — Inpatient Hospital Stay (HOSPITAL_COMMUNITY): Payer: BLUE CROSS/BLUE SHIELD | Admitting: Anesthesiology

## 2018-10-22 ENCOUNTER — Other Ambulatory Visit: Payer: Self-pay

## 2018-10-22 ENCOUNTER — Inpatient Hospital Stay (HOSPITAL_COMMUNITY)
Admission: AD | Admit: 2018-10-22 | Discharge: 2018-10-24 | DRG: 785 | Disposition: A | Discharge status: Home / Self Care | Payer: BLUE CROSS/BLUE SHIELD | Attending: Family Medicine | Admitting: Family Medicine

## 2018-10-22 ENCOUNTER — Encounter (HOSPITAL_COMMUNITY)
Admission: AD | Disposition: A | Discharge status: Home / Self Care | Payer: Self-pay | Source: Home / Self Care | Attending: Family Medicine

## 2018-10-22 ENCOUNTER — Encounter (HOSPITAL_COMMUNITY): Payer: Self-pay | Admitting: *Deleted

## 2018-10-22 DIAGNOSIS — O9902 Anemia complicating childbirth: Secondary | ICD-10-CM | POA: Diagnosis present

## 2018-10-22 DIAGNOSIS — Z302 Encounter for sterilization: Secondary | ICD-10-CM | POA: Diagnosis not present

## 2018-10-22 DIAGNOSIS — Z6791 Unspecified blood type, Rh negative: Secondary | ICD-10-CM | POA: Diagnosis not present

## 2018-10-22 DIAGNOSIS — O99213 Obesity complicating pregnancy, third trimester: Secondary | ICD-10-CM

## 2018-10-22 DIAGNOSIS — Z98891 History of uterine scar from previous surgery: Secondary | ICD-10-CM

## 2018-10-22 DIAGNOSIS — E669 Obesity, unspecified: Secondary | ICD-10-CM | POA: Diagnosis present

## 2018-10-22 DIAGNOSIS — D649 Anemia, unspecified: Secondary | ICD-10-CM | POA: Diagnosis present

## 2018-10-22 DIAGNOSIS — Z3A39 39 weeks gestation of pregnancy: Secondary | ICD-10-CM

## 2018-10-22 DIAGNOSIS — O99214 Obesity complicating childbirth: Secondary | ICD-10-CM | POA: Diagnosis present

## 2018-10-22 DIAGNOSIS — O34211 Maternal care for low transverse scar from previous cesarean delivery: Principal | ICD-10-CM | POA: Diagnosis present

## 2018-10-22 DIAGNOSIS — Z8489 Family history of other specified conditions: Secondary | ICD-10-CM

## 2018-10-22 DIAGNOSIS — Z3480 Encounter for supervision of other normal pregnancy, unspecified trimester: Secondary | ICD-10-CM

## 2018-10-22 DIAGNOSIS — O26899 Other specified pregnancy related conditions, unspecified trimester: Secondary | ICD-10-CM

## 2018-10-22 DIAGNOSIS — O26893 Other specified pregnancy related conditions, third trimester: Secondary | ICD-10-CM | POA: Diagnosis present

## 2018-10-22 LAB — CREATININE, SERUM
Creatinine, Ser: 0.45 mg/dL (ref 0.44–1.00)
GFR calc non Af Amer: >60 mL/min (ref >60)

## 2018-10-22 LAB — RPR: RPR Ser Ql: NONREACTIVE

## 2018-10-22 SURGERY — Surgical Case
Anesthesia: Spinal

## 2018-10-22 MED ORDER — PHENYLEPHRINE 8 MG IN D5W 100 ML (0.08MG/ML) PREMIX OPTIME
INJECTION | INTRAVENOUS | Status: DC | PRN
  Administered 2018-10-22: 60 ug/min via INTRAVENOUS

## 2018-10-22 MED ORDER — NALBUPHINE HCL 10 MG/ML IJ SOLN: 5.0000 mg | INTRAMUSCULAR | Status: DC | PRN

## 2018-10-22 MED ORDER — ONDANSETRON HCL 4 MG/2ML IJ SOLN
INTRAMUSCULAR | Status: AC
  Filled 2018-10-22: qty 2

## 2018-10-22 MED ORDER — SOD CITRATE-CITRIC ACID 500-334 MG/5ML PO SOLN
30.0000 mL | ORAL | Status: AC
  Administered 2018-10-22: 30 mL via ORAL
  Filled 2018-10-22: qty 15

## 2018-10-22 MED ORDER — FENTANYL CITRATE (PF) 100 MCG/2ML IJ SOLN
25.0000 ug | INTRAMUSCULAR | Status: DC | PRN
  Administered 2018-10-22 (×2): 25 ug via INTRAVENOUS

## 2018-10-22 MED ORDER — SENNOSIDES-DOCUSATE SODIUM 8.6-50 MG PO TABS
2.0000 | ORAL_TABLET | ORAL | Status: DC
  Administered 2018-10-22 – 2018-10-23 (×2): 2 via ORAL
  Filled 2018-10-22 (×2): qty 2

## 2018-10-22 MED ORDER — DIPHENHYDRAMINE HCL 50 MG/ML IJ SOLN
INTRAMUSCULAR | Status: AC
  Filled 2018-10-22: qty 1

## 2018-10-22 MED ORDER — SODIUM CHLORIDE 0.9% FLUSH: 3.0000 mL | INTRAVENOUS | Status: DC | PRN

## 2018-10-22 MED ORDER — FENTANYL CITRATE (PF) 100 MCG/2ML IJ SOLN
INTRAMUSCULAR | Status: DC | PRN
  Administered 2018-10-22: 15 ug via INTRAVENOUS

## 2018-10-22 MED ORDER — MORPHINE SULFATE (PF) 0.5 MG/ML IJ SOLN
INTRAMUSCULAR | Status: DC | PRN
  Administered 2018-10-22: .15 mg via EPIDURAL

## 2018-10-22 MED ORDER — COCONUT OIL OIL: 1.0000 "application " | TOPICAL_OIL | Status: DC | PRN

## 2018-10-22 MED ORDER — OXYTOCIN 10 UNIT/ML IJ SOLN
INTRAVENOUS | Status: DC | PRN
  Administered 2018-10-22: 40 [IU] via INTRAVENOUS

## 2018-10-22 MED ORDER — FENTANYL CITRATE (PF) 100 MCG/2ML IJ SOLN
INTRAMUSCULAR | Status: AC
  Filled 2018-10-22: qty 2

## 2018-10-22 MED ORDER — ONDANSETRON HCL 4 MG/2ML IJ SOLN: 4.0000 mg | Freq: Three times a day (TID) | INTRAMUSCULAR | Status: DC | PRN

## 2018-10-22 MED ORDER — NALOXONE HCL 4 MG/10ML IJ SOLN
1.0000 ug/kg/h | INTRAVENOUS | Status: DC | PRN
  Filled 2018-10-22: qty 5

## 2018-10-22 MED ORDER — TETANUS-DIPHTH-ACELL PERTUSSIS 5-2.5-18.5 LF-MCG/0.5 IM SUSP: 0.5000 mL | Freq: Once | INTRAMUSCULAR | Status: DC

## 2018-10-22 MED ORDER — OXYCODONE HCL 5 MG PO TABS: 5.0000 mg | ORAL_TABLET | Freq: Once | ORAL | Status: DC | PRN

## 2018-10-22 MED ORDER — MEPERIDINE HCL 25 MG/ML IJ SOLN: 6.2500 mg | INTRAMUSCULAR | Status: DC | PRN

## 2018-10-22 MED ORDER — OXYCODONE HCL 5 MG PO TABS
5.0000 mg | ORAL_TABLET | ORAL | Status: DC | PRN
  Filled 2018-10-22: qty 1

## 2018-10-22 MED ORDER — PHENYLEPHRINE 8 MG IN D5W 100 ML (0.08MG/ML) PREMIX OPTIME
INJECTION | INTRAVENOUS | Status: AC
  Filled 2018-10-22: qty 100

## 2018-10-22 MED ORDER — DIPHENHYDRAMINE HCL 25 MG PO CAPS: 25.0000 mg | ORAL_CAPSULE | Freq: Four times a day (QID) | ORAL | Status: DC | PRN

## 2018-10-22 MED ORDER — IBUPROFEN 600 MG PO TABS
600.0000 mg | ORAL_TABLET | Freq: Four times a day (QID) | ORAL | Status: DC | PRN
  Administered 2018-10-22 – 2018-10-24 (×7): 600 mg via ORAL
  Filled 2018-10-22 (×7): qty 1

## 2018-10-22 MED ORDER — SIMETHICONE 80 MG PO CHEW
80.0000 mg | CHEWABLE_TABLET | ORAL | Status: DC
  Administered 2018-10-22 – 2018-10-23 (×2): 80 mg via ORAL
  Filled 2018-10-22 (×2): qty 1

## 2018-10-22 MED ORDER — SIMETHICONE 80 MG PO CHEW: 80.0000 mg | CHEWABLE_TABLET | ORAL | Status: DC | PRN

## 2018-10-22 MED ORDER — DIPHENHYDRAMINE HCL 25 MG PO CAPS
25.0000 mg | ORAL_CAPSULE | ORAL | Status: DC | PRN
  Filled 2018-10-22: qty 1

## 2018-10-22 MED ORDER — NALOXONE HCL 0.4 MG/ML IJ SOLN: 0.4000 mg | INTRAMUSCULAR | Status: DC | PRN

## 2018-10-22 MED ORDER — ACETAMINOPHEN 325 MG PO TABS
650.0000 mg | ORAL_TABLET | Freq: Four times a day (QID) | ORAL | Status: DC | PRN
  Administered 2018-10-22 – 2018-10-23 (×4): 650 mg via ORAL
  Filled 2018-10-22 (×4): qty 2

## 2018-10-22 MED ORDER — ZOLPIDEM TARTRATE 5 MG PO TABS: 5.0000 mg | ORAL_TABLET | Freq: Every evening | ORAL | Status: DC | PRN

## 2018-10-22 MED ORDER — LACTATED RINGERS IV SOLN
INTRAVENOUS | Status: DC
  Administered 2018-10-22 – 2018-10-23 (×2): via INTRAVENOUS

## 2018-10-22 MED ORDER — SCOPOLAMINE 1 MG/3DAYS TD PT72: 1.0000 | MEDICATED_PATCH | Freq: Once | TRANSDERMAL | Status: DC

## 2018-10-22 MED ORDER — ONDANSETRON HCL 4 MG/2ML IJ SOLN
INTRAMUSCULAR | Status: DC | PRN
  Administered 2018-10-22: 4 mg via INTRAVENOUS

## 2018-10-22 MED ORDER — OXYTOCIN 40 UNITS IN NORMAL SALINE INFUSION - SIMPLE MED: 2.5000 [IU]/h | INTRAVENOUS | Status: AC

## 2018-10-22 MED ORDER — NALBUPHINE HCL 10 MG/ML IJ SOLN: 5.0000 mg | Freq: Once | INTRAMUSCULAR | Status: DC | PRN

## 2018-10-22 MED ORDER — WITCH HAZEL-GLYCERIN EX PADS: 1.0000 "application " | MEDICATED_PAD | CUTANEOUS | Status: DC | PRN

## 2018-10-22 MED ORDER — PRENATAL MULTIVITAMIN CH
1.0000 | ORAL_TABLET | Freq: Every day | ORAL | Status: DC
  Administered 2018-10-23: 1 via ORAL
  Filled 2018-10-22: qty 1

## 2018-10-22 MED ORDER — BUPIVACAINE-EPINEPHRINE (PF) 0.25% -1:200000 IJ SOLN
INTRAMUSCULAR | Status: AC
  Filled 2018-10-22: qty 30

## 2018-10-22 MED ORDER — SIMETHICONE 80 MG PO CHEW
80.0000 mg | CHEWABLE_TABLET | Freq: Three times a day (TID) | ORAL | Status: DC
  Administered 2018-10-23 – 2018-10-24 (×2): 80 mg via ORAL
  Filled 2018-10-22 (×4): qty 1

## 2018-10-22 MED ORDER — SCOPOLAMINE 1 MG/3DAYS TD PT72
MEDICATED_PATCH | TRANSDERMAL | Status: AC
  Administered 2018-10-22: 1.5 mg
  Filled 2018-10-22: qty 1

## 2018-10-22 MED ORDER — OXYCODONE HCL 5 MG/5ML PO SOLN: 5.0000 mg | Freq: Once | ORAL | Status: DC | PRN

## 2018-10-22 MED ORDER — ONDANSETRON HCL 4 MG/2ML IJ SOLN: 4.0000 mg | Freq: Once | INTRAMUSCULAR | Status: DC | PRN

## 2018-10-22 MED ORDER — MENTHOL 3 MG MT LOZG: 1.0000 | LOZENGE | OROMUCOSAL | Status: DC | PRN

## 2018-10-22 MED ORDER — CEFAZOLIN SODIUM-DEXTROSE 2-4 GM/100ML-% IV SOLN
2.0000 g | INTRAVENOUS | Status: AC
  Administered 2018-10-22: 2 g via INTRAVENOUS
  Filled 2018-10-22: qty 100

## 2018-10-22 MED ORDER — DIBUCAINE 1 % RE OINT: 1.0000 "application " | TOPICAL_OINTMENT | RECTAL | Status: DC | PRN

## 2018-10-22 MED ORDER — LACTATED RINGERS IV SOLN
INTRAVENOUS | Status: DC
  Administered 2018-10-22 (×4): via INTRAVENOUS

## 2018-10-22 MED ORDER — OXYTOCIN 10 UNIT/ML IJ SOLN
INTRAMUSCULAR | Status: AC
  Filled 2018-10-22: qty 4

## 2018-10-22 MED ORDER — DIPHENHYDRAMINE HCL 50 MG/ML IJ SOLN
12.5000 mg | INTRAMUSCULAR | Status: DC | PRN
  Administered 2018-10-22: 12.5 mg via INTRAVENOUS

## 2018-10-22 MED ORDER — BUPIVACAINE IN DEXTROSE 0.75-8.25 % IT SOLN
INTRATHECAL | Status: DC | PRN
  Administered 2018-10-22: 2 mL via INTRATHECAL

## 2018-10-22 MED ORDER — ENOXAPARIN SODIUM 60 MG/0.6ML ~~LOC~~ SOLN
50.0000 mg | SUBCUTANEOUS | Status: DC
  Administered 2018-10-23 – 2018-10-24 (×2): 50 mg via SUBCUTANEOUS
  Filled 2018-10-22 (×4): qty 0.6

## 2018-10-22 MED ORDER — MORPHINE SULFATE (PF) 0.5 MG/ML IJ SOLN
INTRAMUSCULAR | Status: AC
  Filled 2018-10-22: qty 10

## 2018-10-22 SURGICAL SUPPLY — 36 items
BENZOIN TINCTURE PRP APPL 2/3 (GAUZE/BANDAGES/DRESSINGS) ×3 IMPLANT
CANISTER SUCT 3000ML PPV (MISCELLANEOUS) ×3 IMPLANT
CHLORAPREP W/TINT 26ML (MISCELLANEOUS) ×3 IMPLANT
CLAMP CORD UMBIL (MISCELLANEOUS) ×3 IMPLANT
CLOSURE WOUND 1/2 X4 (GAUZE/BANDAGES/DRESSINGS) ×1
CLOTH BEACON ORANGE TIMEOUT ST (SAFETY) ×3 IMPLANT
DRAPE SHEET LG 3/4 BI-LAMINATE (DRAPES) ×3 IMPLANT
DRESSING PREVENA PLUS CUSTOM (GAUZE/BANDAGES/DRESSINGS) ×1 IMPLANT
DRSG OPSITE POSTOP 4X10 (GAUZE/BANDAGES/DRESSINGS) ×3 IMPLANT
DRSG PREVENA PLUS CUSTOM (GAUZE/BANDAGES/DRESSINGS) ×3
ELECT REM PT RETURN 9FT ADLT (ELECTROSURGICAL) ×3
ELECTRODE REM PT RTRN 9FT ADLT (ELECTROSURGICAL) ×1 IMPLANT
GLOVE BIOGEL PI IND STRL 7.0 (GLOVE) ×3 IMPLANT
GLOVE BIOGEL PI INDICATOR 7.0 (GLOVE) ×6
GLOVE ECLIPSE 6.5 STRL STRAW (GLOVE) ×3 IMPLANT
GOWN STRL REUS W/ TWL LRG LVL3 (GOWN DISPOSABLE) ×2 IMPLANT
GOWN STRL REUS W/TWL LRG LVL3 (GOWN DISPOSABLE) ×4
KIT PREVENA INCISION MGT20CM45 (CANNISTER) ×3 IMPLANT
NEEDLE HYPO 22GX1.5 SAFETY (NEEDLE) ×3 IMPLANT
NS IRRIG 1000ML POUR BTL (IV SOLUTION) ×3 IMPLANT
PAD OB MATERNITY 4.3X12.25 (PERSONAL CARE ITEMS) ×3 IMPLANT
PAD PREP 24X48 CUFFED NSTRL (MISCELLANEOUS) ×3 IMPLANT
RETRACTOR WND ALEXIS 25 LRG (MISCELLANEOUS) IMPLANT
RTRCTR WOUND ALEXIS 25CM LRG (MISCELLANEOUS)
SPONGE LAP 18X18 RF (DISPOSABLE) ×9 IMPLANT
STRIP CLOSURE SKIN 1/2X4 (GAUZE/BANDAGES/DRESSINGS) ×2 IMPLANT
SUT PLAIN 2 0 XLH (SUTURE) ×3 IMPLANT
SUT VIC AB 0 CT1 36 (SUTURE) ×6 IMPLANT
SUT VIC AB 2-0 CT1 (SUTURE) ×6 IMPLANT
SUT VIC AB 2-0 CT1 27 (SUTURE) ×2
SUT VIC AB 2-0 CT1 TAPERPNT 27 (SUTURE) ×1 IMPLANT
SUT VIC AB 4-0 KS 27 (SUTURE) ×3 IMPLANT
SYR CONTROL 10ML LL (SYRINGE) ×3 IMPLANT
TOWEL OR 17X24 6PK STRL BLUE (TOWEL DISPOSABLE) ×9 IMPLANT
TRAY FOLEY CATH SILVER 16FR (SET/KITS/TRAYS/PACK) ×3 IMPLANT
WATER STERILE IRR 1000ML POUR (IV SOLUTION) ×3 IMPLANT

## 2018-10-22 NOTE — Anesthesia Procedure Notes (Signed)
Spinal  Patient location during procedure: OR Staffing Anesthesiologist: Marelly Wehrman E, MD Performed: anesthesiologist  Preanesthetic Checklist Completed: patient identified, surgical consent, pre-op evaluation, timeout performed, IV checked, risks and benefits discussed and monitors and equipment checked Spinal Block Patient position: sitting Prep: site prepped and draped and DuraPrep Patient monitoring: continuous pulse ox, blood pressure and heart rate Approach: midline Location: L3-4 Injection technique: single-shot Needle Needle type: Pencan  Needle gauge: 24 G Needle length: 9 cm Additional Notes Functioning IV was confirmed and monitors were applied. Sterile prep and drape, including hand hygiene and sterile gloves were used. The patient was positioned and the spine was prepped. The skin was anesthetized with lidocaine.  Free flow of clear CSF was obtained prior to injecting local anesthetic into the CSF. The needle was carefully withdrawn. The patient tolerated the procedure well.      

## 2018-10-22 NOTE — H&P (Signed)
Obstetric Preoperative History and Physical  Erin RegisterChristina A Loma [name] is a 28 y.o. [id number] with IUP at 6349w3d presenting for presenting for scheduled cesarean section.  No acute concerns.   [facility]  with [facility] of care at 8 weeks Pregnancy complications or risks: Patient Active Problem List   Diagnosis Date Noted  . Anemia 07/09/2018  . Supervision of other normal pregnancy, antepartum 03/21/2018  . Family history of unexplained neonatal death 08/29/2016  . S/P cesarean section 07/27/2016  . Rh negative status during pregnancy 12/20/2015   She plans to breastfeed She desires bilateral tubal ligation for postpartum contraception.   Prenatal labs and studies: ABO, Rh: --/--/O NEG (02/04 1505) Antibody: POS (02/04 1505) Rubella: 1.50 (07/05 1002) RPR: Non Reactive (02/04 1505)  HBsAg: Negative (07/05 1002)  HIV: Non Reactive (11/19 0847)  GBS: NEGATIVE 2 hr Glucola -- WNL Genetic screening normal Anatomy US normal  Prenatal Transfer Tool  Maternal Diabetes: No Genetic Screening: Normal Maternal Ultrasounds/Referrals: Normal Fetal Ultrasounds or other Referrals:  None Maternal Substance Abuse:  No Significant Maternal Medications:  None Significant Maternal Lab Results: Lab values include: Group B Strep negative  Past Medical History:  Diagnosis Date  . Medical history non-contributory     Past Surgical History:  Procedure Laterality Date  . CESAREAN SECTION    . CESAREAN SECTION N/A 07/26/2016   Procedure: CESAREAN SECTION;  Surgeon: Adam PhenixJames G Arnold, MD;  [facility];  Service: Obstetrics;  Laterality: N/A;  . egg donor x2       OB History  Gravida Para Term Preterm AB Living  3 2 1 1  0 1  SAB TAB Ectopic Multiple Live Births  0 0 0 0 1    # Outcome Date GA Lbr Len/2nd Weight Sex Delivery Anes PTL Lv  3 Current           2 Term 07/26/16 4749w1d  4215 g M CS-LTranv Spinal  LIV     Birth Comments: normal female infant  1  Preterm 04/08/14 3056w4d  2211 g M CS-LTranv Gen  ND     Complications: Antepartum placental abruption    Obstetric Comments  G1: 36wk stat pLTCS @ [facility] (VB, ?abruption, oligo bradycardia and multiple fetal anomalies). Demised neonate.     Social History   Socioeconomic History  . Marital status: Married    Spouse name: Not on file  . Number of children: Not on file  . Years of education: Not on file  . Highest education level: Not on file  Occupational History  . Not on file  Social Needs  . Financial resource strain: Not on file  . Food insecurity:    Worry: Never true    Inability: Never true  . Transportation needs:    Medical: No    Non-medical: No  Tobacco Use  . Smoking status: Never Smoker  . Smokeless tobacco: Never Used  Substance and Sexual Activity  . Alcohol use: No  . Drug use: No  . Sexual activity: Yes  Lifestyle  . Physical activity:    Days per week: Not on file    Minutes per session: Not on file  . Stress: Not at all  Relationships  . Social connections:    Talks on phone: Not on file    Gets together: Not on file    Attends religious service: Not on file    Active member of club or organization: Not on file    Attends meetings  of clubs or organizations: Not on file    Relationship status: Not on file  Other Topics Concern  . Not on file  Social History Narrative  . Not on file    Family History  Problem Relation Age of Onset  . Cancer Mother   . Hypertension Father   . Cancer Father     Medications Prior to Admission  Medication Sig Dispense Refill Last Dose  . meclizine (ANTIVERT) 25 MG tablet Take 1 tablet (25 mg total) by mouth 3 (three) times daily as needed for dizziness. (Patient not taking: Reported on 10/16/2018) 30 tablet 0 Not Taking  . Prenatal Multivit-Min-Fe-FA (PRENATAL VITAMINS) 0.8 MG tablet Take 1 tablet by mouth daily. 90 tablet 3 Taking    Allergies  Allergen Reactions  . Sulfa Antibiotics Rash    Review of  Systems: Negative except for what is mentioned in HPI.  Physical Exam: BP 129/78   Pulse (!) 101   Temp 98.2 F (36.8 C) (Oral)   Resp 20   Ht 5\' 8"  (1.727 m)   Wt 107 kg   LMP 01/19/2018 (Exact Date)   BMI 35.87 kg/m  FHR by Doppler: 128 bpm CONSTITUTIONAL: Well-developed, well-nourished female in no acute distress.  HENT:  Normocephalic, atraumatic, External right and left ear normal. Oropharynx is clear and moist EYES: Conjunctivae and EOM are normal. Pupils are equal, round, and reactive to light. No scleral icterus.  NECK: Normal range of motion, supple, no masses SKIN: Skin is warm and dry. No rash noted. Not diaphoretic. No erythema. No pallor. NEUROLGIC: Alert and oriented to person, place, and time. Normal reflexes, muscle tone coordination. No cranial nerve deficit noted. PSYCHIATRIC: Normal mood and affect. Normal behavior. Normal judgment and thought content. CARDIOVASCULAR: Normal heart rate noted, regular rhythm RESPIRATORY: Effort and breath sounds normal, no problems with respiration noted ABDOMEN: Soft, nontender, nondistended, gravid. Well-healed Pfannenstiel incision. PELVIC: Deferred MUSCULOSKELETAL: Normal range of motion. No edema and no tenderness. 2+ distal pulses.   Pertinent Labs/Studies:   Results for orders placed or performed during the hospital encounter of 10/21/18 (from the past 72 hour(s))  CBC     Status: Abnormal   Collection Time: 10/21/18  3:05 PM  Result Value Ref Range   WBC 9.5 4.0 - 10.5 K/uL   RBC 4.43 3.87 - 5.11 MIL/uL   Hemoglobin 10.7 (L) 12.0 - 15.0 g/dL   HCT 81.0 (L) 17.5 - 10.2 %   MCV 78.1 (L) 80.0 - 100.0 fL   MCH 24.2 (L) 26.0 - 34.0 pg   MCHC 30.9 30.0 - 36.0 g/dL   RDW 58.5 27.7 - 82.4 %   Platelets 261 150 - 400 K/uL   nRBC 0.0 0.0 - 0.2 %    Comment: Performed at [facility], [address] [place], [place] [address]  RPR     Status: None   Collection Time: 10/21/18  3:05 PM  Result Value Ref Range   RPR Ser  Ql Non Reactive Non Reactive    Comment: (NOTE) Performed At: [facility] [address] [place], [place] [id number] Jolene Schimke MD QQ:[id number]   Type and screen [facility]     Status: None (Preliminary result)   Collection Time: 10/21/18  3:05 PM  Result Value Ref Range   ABO/RH(D) O NEG    Antibody Screen POS    Sample Expiration 10/24/2018    Antibody Identification      PASSIVELY ACQUIRED ANTI-D Performed at [facility], [address] [place], [place] [address]    Unit Number [id number]    Blood Component Type RED CELLS,LR    Unit division 00    Status of Unit ALLOCATED    Transfusion Status OK TO TRANSFUSE    Crossmatch Result COMPATIBLE    Unit Number [id number]    Blood Component Type RBC LR PHER2    Unit division 00    Status of Unit ALLOCATED    Transfusion Status OK TO TRANSFUSE    Crossmatch Result COMPATIBLE     Assessment and Plan :Erin [name] is a 28 y.o. G3P1101 at [redacted]w[redacted]d being admitted being admitted for scheduled cesarean section. The risks of cesarean section discussed with the patient included but were not limited to: bleeding which may require transfusion or reoperation; infection which may require antibiotics; injury to bowel, bladder, ureters or other surrounding organs; injury to the fetus; need for additional procedures including hysterectomy in the event of a life-threatening hemorrhage; placental abnormalities wth subsequent pregnancies, incisional problems, thromboembolic phenomenon and other postoperative/anesthesia complications. The patient concurred with the proposed plan, giving informed written consent for the procedure. Patient has been NPO since last night she will remain NPO for procedure. Anesthesia and OR aware. Preoperative prophylactic antibiotics and SCDs ordered on call to the OR. To OR when ready.   Patient also desires permanent sterilization.  Other reversible forms of contraception  were discussed with patient; she declines all other modalities. Risks of procedure discussed with patient including but not limited to: risk of regret, permanence of method, bleeding, infection, injury to surrounding organs and need for additional procedures.  Failure risk of 1-2% with increased risk of ectopic gestation if pregnancy occurs was also discussed with patient.  The patient concurred with the proposed plan, giving informed written consent for the procedures.     Federico Flake, MD, MPH, ABFM Attending Physician Faculty Practice- Center for [facility]

## 2018-10-22 NOTE — Transfer of Care (Signed)
Immediate Anesthesia Transfer of Care Note  Patient: Erin [name]  Procedure(s) Performed: REPEAT CESAREAN SECTION WITH BILATERAL TUBAL LIGATION (N/A )  Patient Location: PACU  Anesthesia Type:Spinal  Level of Consciousness: awake  Airway & Oxygen Therapy: Patient Spontanous Breathing  Post-op Assessment: Report given to RN  Post vital signs: Reviewed and stable  Last Vitals:  Vitals Value Taken Time  BP    Temp    Pulse    Resp    SpO2      Last Pain:  Vitals:   10/22/18 0756  TempSrc: Oral  PainSc: 0-No pain         Complications: No apparent anesthesia complications

## 2018-10-22 NOTE — Op Note (Signed)
Operative Report  PATIENT: Erin [name]  PROCEDURE DATE: 10/22/2018  PREOPERATIVE DIAGNOSES: Intrauterine pregnancy at [redacted]w[redacted]d weeks gestation; previous CS x2;  Undesired fertility  POSTOPERATIVE DIAGNOSES: The same  PROCEDURE: Repeat Low Transverse Cesarean Section; Bilateral Tubal Sterilization using Filshie clips  SURGEON:   Surgeon(s) and Role:    * Federico Flake, MD - Primary    * Arvilla Market, DO -OB Fellow  ASSISTANT:  Erin Siren, DO - OB Fellow   INDICATIONS: Erin [name] is a 28 y.o. [phone] at [redacted]w[redacted]d here for cesarean section secondary to the indications listed under preoperative diagnoses; please see preoperative note for further details.  The risks of cesarean section were discussed with the patient including but were not limited to: bleeding which may require transfusion or reoperation; infection which may require antibiotics; injury to bowel, bladder, ureters or other surrounding organs; injury to the fetus; need for additional procedures including hysterectomy in the event of a life-threatening hemorrhage; placental abnormalities wth subsequent pregnancies, incisional problems, thromboembolic phenomenon and other postoperative/anesthesia complications. Patient desires permanent sterilization. Risks and benefits of procedure discussed with patient including permanence of method, bleeding, infection, injury to surrounding organs and need for additional procedures. Risk failure of 0.5-1% with increased risk of ectopic gestation if pregnancy occurs was also discussed with patient.The patient concurred with the proposed plan, giving informed written consent for the procedure.    FINDINGS:  Viable female infant in cephalic presentation.  Apgars 9 and 9.  Clear amniotic fluid.  Intact placenta, three vessel cord.  Normal uterus, fallopian tubes and ovaries bilaterally. Bladder reflection adherent to the uterus.   ANESTHESIA:  Spinal INTRAVENOUS FLUIDS: 2600 mL  ESTIMATED BLOOD LOSS: 302 mL URINE OUTPUT:  100 ml SPECIMENS: Placenta sent to L&D COMPLICATIONS: None immediate  PROCEDURE IN DETAIL:  The patient preoperatively received intravenous antibiotics and had sequential compression devices applied to her lower extremities.  She was then taken to the operating room where spinal anesthesia was administered and was found to be adequate. She was then placed in a dorsal supine position with a leftward tilt, and prepped and draped in a sterile manner.  A foley catheter was placed into her bladder and attached to constant gravity.    After an adequate timeout was performed, a Pfannenstiel skin incision was made with scalpel slightly above her preexisting scar and carried through to the underlying layer of fascia. The fascia was incised in the midline, and this incision was extended bilaterally using the Mayo scissors.  Kocher clamps were applied to the superior aspect of the fascial incision and the underlying rectus muscles were dissected off bluntly.  A similar process was carried out on the inferior aspect of the fascial incision. The rectus muscles were separated in the midline bluntly and the peritoneum was entered bluntly. The utero-vesical peritoneal reflection was incised transversely and the bladder flap was bluntly freed from the lower uterine segment. Attention was turned to the lower uterine segment where a low transverse hysterotomy was made with a scalpel and extended bilaterally bluntly.  The infant was successfully delivered, the cord was clamped and cut after one minute, and the infant was handed over to the awaiting neonatology team. Uterine massage was then administered, and the placenta delivered intact with a three-vessel cord. The uterus was then cleared of clots and debris.  The hysterotomy was closed with 0 Vicryl in a running locked fashion, and an imbricating layer was also placed with 0 Vicryl.  The  patient's left  fallopian tube was then identified and grasped with a Babcock clamp. The tube was then followed out to the fimbria. The Babcock clamp was then used to grasp the tube approximately 4 cm from the cornual region. A 3 cm segment of the tube was then ligated with free tie of plain gut suture, transected and excised. Good hemostasis was noted and the tube was returned to the abdomen. The right fallopian tube was then identified to its fimbriated end, ligated, and a 3 cm segment excised in a similar fashion. Excellent hemostasis was noted. The pelvis was cleared of all clot and debris. Hemostasis was confirmed on all surfaces.  The peritoneum was closed with a 0 Vicryl running stitch. The fascia was then closed using 2 Vicryl in a running fashion.  The subcutaneous layer was irrigated, then reapproximated with 2-0 plain gut running stitch.  The skin was closed with a 4-0 Vicryl subcuticular stitch.   The patient tolerated the procedure well. Sponge, lap, instrument and needle counts were correct x 3.  She was taken to the recovery room in stable condition.     Disposition: PACU - hemodynamically stable.   Maternal Condition: stable   Erin Sirenatherine Fidel Caggiano, D.O. OB Fellow  10/22/2018, 12:06 PM

## 2018-10-22 NOTE — Anesthesia Postprocedure Evaluation (Signed)
Anesthesia Post Note  Patient: Erin [name]  Procedure(s) Performed: REPEAT CESAREAN SECTION WITH BILATERAL TUBAL LIGATION (N/A )     Patient location during evaluation: Mother Baby Anesthesia Type: Spinal Level of consciousness: awake and alert Pain management: pain level controlled Vital Signs Assessment: post-procedure vital signs reviewed and stable Respiratory status: spontaneous breathing, nonlabored ventilation and respiratory function stable Cardiovascular status: stable Postop Assessment: no headache, no backache, no apparent nausea or vomiting, patient able to bend at knees, able to ambulate, spinal receding and adequate PO intake Anesthetic complications: no    Last Vitals:  Vitals:   10/22/18 1317 10/22/18 1421  BP: 119/77 125/70  Pulse: 66 67  Resp: 18 17  Temp: 36.8 C 36.5 C  SpO2: 96% 96%    Last Pain:  Vitals:   10/22/18 1421  TempSrc: Axillary  PainSc: 0-No pain   Pain Goal:                   Eliza Green Hristova

## 2018-10-22 NOTE — Anesthesia Preprocedure Evaluation (Addendum)
Anesthesia Evaluation  Patient identified by MRN, date of birth, ID band Patient awake    Reviewed: Allergy & Precautions, NPO status , Patient's Chart, lab work & pertinent test results  History of Anesthesia Complications Negative for: history of anesthetic complications  Airway Mallampati: II  TM Distance: >3 FB Neck ROM: Full    Dental no notable dental hx.    Pulmonary neg pulmonary ROS,    Pulmonary exam normal        Cardiovascular negative cardio ROS Normal cardiovascular exam     Neuro/Psych negative neurological ROS  negative psych ROS   GI/Hepatic negative GI ROS, Neg liver ROS,   Endo/Other  negative endocrine ROS  Renal/GU negative Renal ROS  negative genitourinary   Musculoskeletal negative musculoskeletal ROS (+)   Abdominal   Peds  Hematology  (+) anemia ,   Anesthesia Other Findings   Reproductive/Obstetrics (+) Pregnancy Prior C/S x2 Antibody positive                            Anesthesia Physical Anesthesia Plan  ASA: II  Anesthesia Plan: Spinal   Post-op Pain Management:    Induction:   PONV Risk Score and Plan: 3 and Ondansetron, Dexamethasone and Treatment may vary due to age or medical condition  Airway Management Planned: Natural Airway  Additional Equipment: None  Intra-op Plan:   Post-operative Plan:   Informed Consent: I have reviewed the patients History and Physical, chart, labs and discussed the procedure including the risks, benefits and alternatives for the proposed anesthesia with the patient or authorized representative who has indicated his/her understanding and acceptance.       Plan Discussed with:   Anesthesia Plan Comments:        Anesthesia Quick Evaluation

## 2018-10-23 LAB — CBC
HCT: 30.2 % — ABNORMAL LOW (ref 36.0–46.0)
Hemoglobin: 9.2 g/dL — ABNORMAL LOW (ref 12.0–15.0)
MCH: 23.9 pg — ABNORMAL LOW (ref 26.0–34.0)
MCHC: 30.5 g/dL (ref 30.0–36.0)
MCV: 78.4 fL — ABNORMAL LOW (ref 80.0–100.0)
NRBC: 0 % (ref 0.0–0.2)
Platelets: 202 10*3/uL (ref 150–400)
RBC: 3.85 MIL/uL — ABNORMAL LOW (ref 3.87–5.11)
RDW: 15.6 % — ABNORMAL HIGH (ref 11.5–15.5)
WBC: 8.9 10*3/uL (ref 4.0–10.5)

## 2018-10-23 LAB — BIRTH TISSUE RECOVERY COLLECTION (PLACENTA DONATION)

## 2018-10-23 MED ORDER — RHO D IMMUNE GLOBULIN 1500 UNIT/2ML IJ SOSY
300.0000 ug | PREFILLED_SYRINGE | Freq: Once | INTRAMUSCULAR | Status: AC
  Administered 2018-10-23: 300 ug via INTRAVENOUS
  Filled 2018-10-23: qty 2

## 2018-10-23 NOTE — Progress Notes (Signed)
Post Op Day 1 Subjective: no complaints, up ad lib, voiding, tolerating PO and + flatus  Objective: Blood pressure (!) 105/58, pulse (!) 56, temperature 98.3 F (36.8 C), temperature source Oral, resp. rate 16, height 5\' 8"  (1.727 m), weight 107 kg, last menstrual period 01/19/2018, SpO2 96 %, unknown if currently breastfeeding.  Physical Exam:  General: alert, cooperative, appears stated age and no distress Lochia: appropriate Uterine Fundus: firm Incision: healing well, no significant drainage, no dehiscence, no significant erythema DVT Evaluation: No evidence of DVT seen on physical exam.  Recent Labs    10/21/18 1505 10/23/18 0618  HGB 10.7* 9.2*  HCT 34.6* 30.2*    Assessment/Plan: Plan for discharge tomorrow   LOS: 1 day   Erin [name] 10/23/2018, 10:00 AM

## 2018-10-23 NOTE — Discharge Summary (Signed)
OB Discharge Summary     Patient Name: Erin [name] DOB: 02/28/1991 MRN: [id number]  Date of admission: 10/22/2018 Delivering MD: Erin [name]   Date of discharge: 10/24/18   Admitting diagnosis: RCS Undesired Fertility Intrauterine pregnancy: [redacted]w[redacted]d     Secondary diagnosis:  Principal Problem:   S/P cesarean section Active Problems:   Rh negative status during pregnancy   Family history of unexplained neonatal death   Supervision of other normal pregnancy, antepartum   Anemia   Obesity affecting pregnancy in third trimester   Obesity (BMI 30-39.9)   S/P repeat low transverse C-section  Additional problems:      Discharge diagnosis: Term Pregnancy Delivered                                                                                                Post partum procedures:BTL  Augmentation:   Complications: None  Hospital course:  Sceduled C/S   28 y.o. yo [id number] at [redacted]w[redacted]d was admitted to the hospital 10/22/2018 for scheduled cesarean section with the following indication:Elective Repeat.  Membrane Rupture Time/Date: 10:47 AM ,10/22/2018   Patient delivered a Viable infant.10/22/2018  Details of operation can be found in separate operative note.  Pateint had an uncomplicated postpartum course.  She is ambulating, tolerating a regular diet, passing flatus, and urinating well. Patient is discharged home in stable condition on  10/24/18         Physical exam  Vitals:   10/23/18 0500 10/23/18 1454 10/23/18 2200 10/24/18 0500  BP: (!) 105/58 122/79 120/69 116/66  Pulse: (!) 56 65 68 (!) 58  Resp: 16 16 16 18   Temp: 98.3 F (36.8 C) 98.2 F (36.8 C)  99.1 F (37.3 C)  TempSrc: Oral Oral  Oral  SpO2:    98%  Weight:      Height:       General: alert, cooperative and no distress Lochia: appropriate Uterine Fundus: firm Incision: Healing well with no significant drainage, No significant erythema, Dressing is clean, dry, and intact, drain in place DVT  Evaluation: No evidence of DVT seen on physical exam. Negative Homan's sign. No cords or calf tenderness. Labs: Lab Results  Component Value Date   WBC 8.9 10/23/2018   HGB 9.2 (L) 10/23/2018   HCT 30.2 (L) 10/23/2018   MCV 78.4 (L) 10/23/2018   PLT 202 10/23/2018   CMP Latest Ref Rng & Units 10/22/2018  Glucose 65 - 99 mg/dL -  BUN 6 - 20 mg/dL -  Creatinine 9.37 - 1.69 mg/dL 6.78  Sodium 938 - 101 mmol/L -  Potassium 3.5 - 5.2 mmol/L -  Chloride 96 - 106 mmol/L -  CO2 18 - 29 mmol/L -  Calcium 8.7 - 10.2 mg/dL -  Total Protein 6.0 - 8.5 g/dL -  Total Bilirubin 0.0 - 1.2 mg/dL -  Alkaline Phos 39 - 751 IU/L -  AST 0 - 40 IU/L -  ALT 0 - 32 IU/L -    Discharge instruction: per After Visit Summary and "Baby and Me Booklet".  After visit meds:  Allergies as of  10/24/2018      Reactions   Sulfa Antibiotics Rash      Medication List    STOP taking these medications   meclizine 25 MG tablet Commonly known as:  ANTIVERT     TAKE these medications   ibuprofen 600 MG tablet Commonly known as:  ADVIL,MOTRIN Take 1 tablet (600 mg total) by mouth every 6 (six) hours as needed for mild pain or moderate pain.   oxyCODONE 5 MG immediate release tablet Commonly known as:  Oxy IR/ROXICODONE Take 1 tablet (5 mg total) by mouth every 4 (four) hours as needed for severe pain or breakthrough pain.   Prenatal Vitamins 0.8 MG tablet Take 1 tablet by mouth daily.       Diet: routine diet  Activity: Advance as tolerated. Pelvic rest for 6 weeks.   Outpatient follow up: Follow up Appt: Future Appointments  Date Time Provider Department Center  11/05/2018 10:30 AM [facility] NURSE [facility]  11/19/2018  2:15 PM Allie Bossierove, Myra C, MD [facility]   Follow up Visit:No follow-ups on file.  Postpartum contraception: Tubal Ligation  Newborn Data: Live born female  Birth Weight: 7 lb 15.5 oz (3615 g) APGAR: 9, 9  Newborn Delivery   Birth date/time:  10/22/2018 10:48:00 Delivery  type:  C-Section, Low Vertical Trial of labor:  No C-section categorization:  Repeat     Baby Feeding: Breast Disposition:home with mother   10/24/2018 Erin [name], CNM

## 2018-10-23 NOTE — Lactation Note (Signed)
This note was copied from a baby's chart. Lactation Consultation Note  Patient Name: Girl Loura Geary [id number] Date: 10/23/2018 Reason for consult: Initial assessment Baby is 23 hours old/39.3 weeks.  Mom breastfed her now 28 year old for 6 months but needed to supplement with formula.  She reports that baby is latching with a nipple shield but still hungry so also supplementing with formula.  Baby receiving formula now.  Recommended mom start pumping every 3 hours with nipple shield use.  She will have her mother bring her Lansinoh DEBP in today.  Offered to set up Symphony pump but mom prefers to use her pump.  Instructed to post pump every 3 hours due to nipple shield and hx of low milk supply.  Mom will continue to feed with cues and call for assist/concerns prn.  Maternal Data Has patient been taught Hand Expression?: Yes Does the patient have breastfeeding experience prior to this delivery?: Yes  Feeding Feeding Type: Bottle Fed - Formula  LATCH Score Latch: Repeated attempts needed to sustain latch, nipple held in mouth throughout feeding, stimulation needed to elicit sucking reflex.  Audible Swallowing: None  Type of Nipple: Flat  Comfort (Breast/Nipple): Soft / non-tender  Hold (Positioning): No assistance needed to correctly position infant at breast.  LATCH Score: 6  Interventions    Lactation Tools Discussed/Used     Consult Status Consult Status: Follow-up Date: 10/24/18 Follow-up type: In-patient    Huston Foley 10/23/2018, 10:47 AM

## 2018-10-24 LAB — RH IG WORKUP (INCLUDES ABO/RH)
ABO/RH(D): O NEG
Fetal Screen: NEGATIVE
Gestational Age(Wks): 39.3
Unit division: 0

## 2018-10-24 MED ORDER — OXYCODONE HCL 5 MG PO TABS: 5.0000 mg | ORAL_TABLET | ORAL | 0 refills | Status: DC | PRN

## 2018-10-24 MED ORDER — IBUPROFEN 600 MG PO TABS: 600.0000 mg | ORAL_TABLET | Freq: Four times a day (QID) | ORAL | 0 refills | Status: DC | PRN

## 2018-10-24 NOTE — Lactation Note (Signed)
This note was copied from a baby's chart. Lactation Consultation Note  Patient Name: Erin [name] [id number] Date: 10/24/2018 Reason for consult: Follow-up assessment;Term Baby is 49 hours old/4% weight loss.  Mom is latching baby some with a nipple shield and also supplementing with formula.  She is pumping with symphony pump.  Stressed importance of pumping every 3 hours to establish and maintain her milk supply.  Lactation outpatient services and support reviewed and encouraged.  Maternal Data    Feeding    LATCH Score                   Interventions    Lactation Tools Discussed/Used     Consult Status Consult Status: Complete Follow-up type: Call as needed    Huston Foley 10/24/2018, 12:04 PM

## 2018-10-25 LAB — TYPE AND SCREEN
ABO/RH(D): O NEG
Antibody Screen: POSITIVE
Unit division: 0
Unit division: 0

## 2018-10-25 LAB — BPAM RBC
BLOOD PRODUCT EXPIRATION DATE: 202002182359
Blood Product Expiration Date: 202002192359
Unit Type and Rh: 9500
Unit Type and Rh: 9500

## 2018-10-28 ENCOUNTER — Inpatient Hospital Stay (HOSPITAL_COMMUNITY)
Admission: AD | Admit: 2018-10-28 | Discharge: 2018-10-28 | Disposition: A | Discharge status: Home / Self Care | Payer: BLUE CROSS/BLUE SHIELD | Attending: Obstetrics & Gynecology | Admitting: Obstetrics & Gynecology

## 2018-10-28 ENCOUNTER — Encounter (HOSPITAL_COMMUNITY): Payer: Self-pay

## 2018-10-28 DIAGNOSIS — O9089 Other complications of the puerperium, not elsewhere classified: Secondary | ICD-10-CM | POA: Diagnosis not present

## 2018-10-28 DIAGNOSIS — Z4889 Encounter for other specified surgical aftercare: Secondary | ICD-10-CM

## 2018-10-28 NOTE — MAU Provider Note (Signed)
[facility] OB[facility]N Attending MAU Note  Subjective:  Patient here today for evaluation of wound "opening up" while trying to remove her negative wound dressing at home today.  No drainage, pain or other concerns. Had cesarean section on 10/22/2018.    Objective:  Blood pressure 108/78, pulse 82, temperature 98.7 F (37.1 C), temperature source Oral, resp. rate 18, unknown if currently breastfeeding. Gen: NAD HENT: Normocephalic, atraumatic Lungs: Normal respiratory effort Heart: Regular rate noted Abdomen: NT, firm fundus, soft. Prevena dressing pulled back on right side, small 8 mm wide superficial overlapping of the inferior incision edge noted, no dehiscence.  No drainage. No erythema. Prevena dressing removed. Area treated with silver nitrate, reapproximated with steristrips. Cervix: Deferred Ext: 2+ DTRs, no noted edema  Assessment & Plan:  28 y.o. [id number] POD#6 here for wound check.  Wound with a small area of overlapping inferior edge; treated with silver nitrate and re-approximated with steristrips.  No other concerns.  Told to keep steristrips on for 2-3 days. Advised to call office/come back to MAU for any other concerns. She is scheduled to follow up on 11/05/18 in the office.     Jaynie Collins, MD, FACOG Obstetrician & Gynecologist, [facility] [employer], [facility]

## 2018-10-28 NOTE — Discharge Instructions (Signed)
Incision Care, Adult °An incision is a surgical cut that is made through your skin. Most incisions are closed after surgery. Your incision may be closed with stitches (sutures), staples, skin glue, or adhesive strips. You may need to return to your health care provider to have sutures or staples removed. This may occur several days to several weeks after your surgery. The incision needs to be cared for properly to prevent infection. °How to care for your incision °Incision care ° °· Follow instructions from your health care provider about how to take care of your incision. Make sure you: °? Wash your hands with soap and water before you change the bandage (dressing). If soap and water are not available, use hand sanitizer. °? Change your dressing as told by your health care provider. °? Leave sutures, skin glue, or adhesive strips in place. These skin closures may need to stay in place for 2 weeks or longer. If adhesive strip edges start to loosen and curl up, you may trim the loose edges. Do not remove adhesive strips completely unless your health care provider tells you to do that. °· Check your incision area every day for signs of infection. Check for: °? More redness, swelling, or pain. °? More fluid or blood. °? Warmth. °? Pus or a bad smell. °· Ask your health care provider how to clean the incision. This may include: °? Using mild soap and water. °? Using a clean towel to pat the incision dry after cleaning it. °? Applying a cream or ointment. Do this only as told by your health care provider. °? Covering the incision with a clean dressing. °· Ask your health care provider when you can leave the incision uncovered. °· Do not take baths, swim, or use a hot tub until your health care provider approves. Ask your health care provider if you can take showers. You may only be allowed to take sponge baths for bathing. °Medicines °· If you were prescribed an antibiotic medicine, cream, or ointment, take or apply the  antibiotic as told by your health care provider. Do not stop taking or applying the antibiotic even if your condition improves. °· Take over-the-counter and prescription medicines only as told by your health care provider. °General instructions °· Limit movement around your incision to improve healing. °? Avoid straining, lifting, or exercise for the first month, or for as long as told by your health care provider. °? Follow instructions from your health care provider about returning to your normal activities. °? Ask your health care provider what activities are safe. °· Protect your incision from the sun when you are outside for the first 6 months, or for as long as told by your health care provider. Apply sunscreen around the scar or cover it up. °· Keep all follow-up visits as told by your health care provider. This is important. °Contact a health care provider if: °· Your have more redness, swelling, or pain around the incision. °· You have more fluid or blood coming from the incision. °· Your incision feels warm to the touch. °· You have pus or a bad smell coming from the incision. °· You have a fever or shaking chills. °· You are nauseous or you vomit. °· You are dizzy. °· Your sutures or staples come undone. °Get help right away if: °· You have a red streak coming from your incision. °· Your incision bleeds through the dressing and the bleeding does not stop with gentle pressure. °· The edges of   your incision open up and separate. °· You have severe pain. °· You have a rash. °· You are confused. °· You faint. °· You have trouble breathing and a fast heartbeat. °This information is not intended to replace advice given to you by your health care provider. Make sure you discuss any questions you have with your health care provider. °Document Released: 03/23/2005 Document Revised: 05/11/2016 Document Reviewed: 03/21/2016 °Elsevier Interactive Patient Education © 2019 [employer] ° °

## 2018-10-28 NOTE — MAU Note (Signed)
Pt presents to MAU with c/o her c-section incision opening partially where bandage is removed, she has not removed entire bandage. She denies any discharge from incision, has not had fever and site is not red or warm to touch. C-section was on 2/5.

## 2018-11-05 ENCOUNTER — Ambulatory Visit (INDEPENDENT_AMBULATORY_CARE_PROVIDER_SITE_OTHER): Payer: BLUE CROSS/BLUE SHIELD | Admitting: General Practice

## 2018-11-05 VITALS — BP 129/77 | HR 72 | Ht 68.0 in | Wt 218.0 lb

## 2018-11-05 DIAGNOSIS — Z5189 Encounter for other specified aftercare: Secondary | ICD-10-CM

## 2018-11-05 NOTE — Progress Notes (Signed)
Patient presents to office today for wound check following repeat c-section on 2/5. Patient reports doing well since then. States she went to MAU the other week because she was worried her incision had came open after removing the wound vac. Incision is clean & dry. Two small areas of healing granulation tissue noted otherwise well approximated. Wound care and signs & symptoms of infection reviewed with patient. Patient verbalized understanding & will follow up 3/4 for pp visit.   Chase Caller RN BSN 11/05/18

## 2018-11-05 NOTE — Progress Notes (Signed)
I have reviewed the chart and agree with nursing staff's documentation of this patient's encounter.  Jaynie Collins, MD 11/05/2018 12:51 PM

## 2018-11-10 NOTE — Addendum Note (Signed)
Addendum  created 11/10/18 1610 by Lucretia Kern, MD   Attestation recorded in Intraprocedure, Intraprocedure Attestations filed

## 2018-11-19 ENCOUNTER — Encounter: Payer: Self-pay | Admitting: Obstetrics & Gynecology

## 2018-11-19 ENCOUNTER — Ambulatory Visit (INDEPENDENT_AMBULATORY_CARE_PROVIDER_SITE_OTHER): Payer: BLUE CROSS/BLUE SHIELD | Admitting: Obstetrics & Gynecology

## 2018-11-19 DIAGNOSIS — Z1389 Encounter for screening for other disorder: Secondary | ICD-10-CM | POA: Diagnosis not present

## 2018-11-19 NOTE — Progress Notes (Signed)
Subjective:     Erin [name] is a 28 y.o. female who presents for a postpartum visit. She is 4 weeks postpartum following a low cervical transverse Cesarean section. I have fully reviewed the prenatal and intrapartum course. The delivery was at 39 gestational weeks. Outcome: repeat cesarean section, low transverse incision. Anesthesia: spinal. Postpartum course has been normal. Baby's course has been normal. Baby is feeding by bottle. Bleeding thin lochia. Bowel function is normal. Bladder function is normal. Patient is not sexually active. Contraception method is tubal ligation. Postpartum depression screening: negative.  The following portions of the patient's history were reviewed and updated as appropriate: allergies, current medications, past family history, past medical history, past social history, past surgical history and problem list.  Review of Systems Pertinent items are noted in HPI.   Objective:   General:  alert   Breasts:  inspection negative, no nipple discharge or bleeding, no masses or nodularity palpable  Lungs: clear to auscultation bilaterally  Heart:  regular rate and rhythm, S1, S2 normal, no murmur, click, rub or gallop  Abdomen: soft, non-tender; bowel sounds normal; no masses,  no organomegaly, incision:   Vulva:  not evaluated  Vagina: not evaluated  Cervix:  not evaluated  Corpus: not examined  Adnexa:  not evaluated  Rectal Exam: Not performed.    Assessment:   Normal postpartum exam. Pap smear not done at today's visit.   Plan:    1. Contraception: tubal ligation

## 2019-05-02 ENCOUNTER — Other Ambulatory Visit: Payer: Self-pay

## 2019-05-02 ENCOUNTER — Inpatient Hospital Stay (HOSPITAL_COMMUNITY)
Admission: AD | Admit: 2019-05-02 | Discharge: 2019-05-02 | Disposition: A | Discharge status: Home / Self Care | Payer: BLUE CROSS/BLUE SHIELD | Attending: Obstetrics and Gynecology | Admitting: Obstetrics and Gynecology

## 2019-05-02 DIAGNOSIS — R109 Unspecified abdominal pain: Secondary | ICD-10-CM | POA: Diagnosis not present

## 2019-05-02 DIAGNOSIS — Z3202 Encounter for pregnancy test, result negative: Secondary | ICD-10-CM | POA: Diagnosis not present

## 2019-05-02 LAB — POCT PREGNANCY, URINE: Preg Test, Ur: NEGATIVE

## 2019-05-02 NOTE — MAU Provider Note (Signed)
First Provider Initiated Contact with Patient 05/02/19 2222      S Ms. Erin [name] is a 28 y.o. G50P2102 non-pregnant female who presents to MAU today with complaint of abdominal pain. Pt is s/p BTL 10/2018.  O BP 125/87 (BP Location: Right Arm)   Pulse 61   Temp 97.9 F (36.6 C) (Oral)   Resp 16   Ht 5\' 8"  (1.727 m)   Wt 96.4 kg   LMP 04/18/2019   SpO2 100%   BMI 32.33 kg/m  Physical Exam  Constitutional: She appears well-developed and well-nourished. No distress.  HENT:  Head: Normocephalic and atraumatic.  Respiratory: Effort normal.  Skin: She is not diaphoretic.  Psychiatric: She has a normal mood and affect. Her behavior is normal. Judgment and thought content normal.   A Non pregnant female Medical screening exam complete  P Discharge from MAU in stable condition Patient given the option of transfer to [facility] for further evaluation or seek care in outpatient facility of choice List of options for follow-up given  Warning signs for worsening condition that would warrant emergency follow-up discussed Patient may return to MAU as needed for pregnancy related complaints  Teagyn Fishel, Gerrie Nordmann, NP 05/02/2019 10:30 PM

## 2019-05-02 NOTE — MAU Note (Signed)
Pt states that ever since her c/s February 5th, 2020 she has had abdominal pain, but in the last week it has been a lot worse. Has felt nauseous and had a HA. Reports she started bleeding tonight. LMP 04/18/2019. States she is not pregnant.

## 2024-09-27 ENCOUNTER — Inpatient Hospital Stay (HOSPITAL_BASED_OUTPATIENT_CLINIC_OR_DEPARTMENT_OTHER): Admit: 2024-09-27 | Discharge: 2024-09-27 | Payer: Self-pay

## 2024-09-27 ENCOUNTER — Encounter (HOSPITAL_BASED_OUTPATIENT_CLINIC_OR_DEPARTMENT_OTHER): Payer: Self-pay

## 2024-09-27 VITALS — BP 114/84 | HR 63 | Temp 98.1°F | Resp 18

## 2024-09-27 DIAGNOSIS — G43819 Other migraine, intractable, without status migrainosus: Secondary | ICD-10-CM

## 2024-09-27 DIAGNOSIS — R110 Nausea: Secondary | ICD-10-CM

## 2024-09-27 MED ORDER — KETOROLAC TROMETHAMINE 30 MG/ML IJ SOLN
30.0000 mg | Freq: Once | INTRAMUSCULAR | Status: AC
  Administered 2024-09-27: 30 mg via INTRAMUSCULAR

## 2024-09-27 MED ORDER — KETOROLAC TROMETHAMINE 10 MG PO TABS: 10.0000 mg | ORAL_TABLET | Freq: Four times a day (QID) | ORAL | 0 refills | Status: AC | PRN

## 2024-09-27 MED ORDER — PROMETHAZINE HCL 12.5 MG PO TABS: ORAL_TABLET | ORAL | 0 refills | Status: AC

## 2024-09-27 NOTE — Discharge Instructions (Addendum)
 Migraine headache with nausea: Ketorolac  30 mg injection now.  Ketorolac  10 mg every 6 hours if needed for headache.  Patient has sumatriptan hand to use if needed.  Promethazine , 12.5 mg, 1 to 2 pills every 8 hours if needed for nausea.  Patient was cautioned that promethazine  can be sedating and she should be cautious about driving after use.  Provided updated education on migraines including food migraine triggers and nonfood migraine triggers and a migraine diary.  Encouraged to get connected with primary care as she may need to start on some kind of migraine preventative medicine.  See below for signs and symptoms of worsening condition that indicate a need to go to an emergency room.  Follow-up with primary care here if needed.  Get help right away if: Your migraine headache becomes severe or lasts more than 72 hours. You have a fever or stiff neck. You have vision loss. Your muscles feel weak or like you cannot control them. You lose your balance often or have trouble walking. You faint. You have a seizure. This information is not intended to replace advice given to you by your health care provider. Make sure you discuss any questions you have with your health care provider.

## 2024-09-27 NOTE — ED Provider Notes (Signed)
 " [facility]    CSN: [id number] Arrival date & time: 09/27/24  1029      History   Chief Complaint No chief complaint on file.   HPI Erin [name] is a 34 y.o. female.   34 year old female who reports a migraine headache that has been persistent since 09/20/2024.  She has taken sumatriptan hand and it has helped the headache some but then the headache just comes right back.  She denies cough, congestion, fever, nausea, vomiting, constipation, diarrhea.     Past Medical History:  Diagnosis Date   Medical history non-contributory     Patient Active Problem List   Diagnosis Date Noted   Obesity affecting pregnancy in third trimester 10/22/2018   Obesity (BMI 30-39.9) 10/22/2018   S/P repeat low transverse C-section 10/22/2018   Anemia 07/09/2018   Supervision of other normal pregnancy, antepartum 03/21/2018   Family history of unexplained neonatal death September 01, 2016   S/P cesarean section 07/27/2016   Rh negative status during pregnancy 12/20/2015    Past Surgical History:  Procedure Laterality Date   CESAREAN SECTION     CESAREAN SECTION N/A 07/26/2016   Procedure: CESAREAN SECTION;  Surgeon: Lynwood KANDICE Solomons, MD;  Location: [facility] BIRTHING SUITES;  Service: Obstetrics;  Laterality: N/A;   CESAREAN SECTION WITH BILATERAL TUBAL LIGATION N/A 10/22/2018   Procedure: REPEAT CESAREAN SECTION WITH BILATERAL TUBAL LIGATION;  Surgeon: Eldonna Suzen Octave, MD;  Location: [facility] BIRTHING SUITES;  Service: Obstetrics;  Laterality: N/A;   egg donor x2       OB History     Gravida  3   Para  3   Term  2   Preterm  1   AB  0   Living  2      SAB  0   IAB  0   Ectopic  0   Multiple  0   Live Births  2        Obstetric Comments  G1: 36wk stat pLTCS @ [facility] (VB, ?abruption, oligo bradycardia and multiple fetal anomalies). Demised neonate.           Home Medications    Prior to Admission medications  Medication Sig Start Date End Date Taking?  Authorizing Provider  ketorolac  (TORADOL ) 10 MG tablet Take 1 tablet (10 mg total) by mouth every 6 (six) hours as needed. 09/27/24  Yes Ival Domino, FNP  promethazine  (PHENERGAN ) 12.5 MG tablet Take 1 to 2 pills (12.5 to 25 mg) every 8 hours as needed for nausea or vomiting. 09/27/24  Yes Ival Domino, FNP  SUMAtriptan (IMITREX) 50 MG tablet Take 50 mg by mouth. 02/19/24  Yes [provider]    Family History Family History  Problem Relation Age of Onset   Cancer Mother    Hypertension Father    Cancer Father     Social History Social History[1]   Allergies   Sulfa antibiotics   Review of Systems Review of Systems  Constitutional:  Negative for chills and fever.  HENT:  Negative for ear pain and sore throat.   Eyes:  Negative for pain and visual disturbance.  Respiratory:  Negative for cough and shortness of breath.   Cardiovascular:  Negative for chest pain and palpitations.  Gastrointestinal:  Negative for abdominal pain, constipation, diarrhea, nausea and vomiting.  Genitourinary:  Negative for dysuria and hematuria.  Musculoskeletal:  Negative for arthralgias and back pain.  Skin:  Negative for color change and rash.  Neurological:  Positive for  headaches. Negative for seizures and syncope.  All other systems reviewed and are negative.    Physical Exam Triage Vital Signs ED Triage Vitals  Encounter Vitals Group     BP 09/27/24 1053 114/84     Girls Systolic BP Percentile --      Girls Diastolic BP Percentile --      Boys Systolic BP Percentile --      Boys Diastolic BP Percentile --      Pulse Rate 09/27/24 1053 63     Resp 09/27/24 1053 18     Temp 09/27/24 1053 98.1 F (36.7 C)     Temp Source 09/27/24 1053 Oral     SpO2 09/27/24 1053 99 %     Weight --      Height --      Head Circumference --      Peak Flow --      Pain Score 09/27/24 1051 5     Pain Loc --      Pain Education --      Exclude from Growth Chart --    No data  found.  Updated Vital Signs BP 114/84 (BP Location: Right Arm)   Pulse 63   Temp 98.1 F (36.7 C) (Oral)   Resp 18   LMP 09/17/2024   SpO2 99%   Breastfeeding No   Visual Acuity Right Eye Distance:   Left Eye Distance:   Bilateral Distance:    Right Eye Near:   Left Eye Near:    Bilateral Near:     Physical Exam Vitals and nursing note reviewed.  Constitutional:      General: She is not in acute distress.    Appearance: She is well-developed. She is not ill-appearing or toxic-appearing.  HENT:     Head: Normocephalic and atraumatic.     Right Ear: Hearing, tympanic membrane, ear canal and external ear normal.     Left Ear: Hearing, tympanic membrane, ear canal and external ear normal.     Nose: No congestion or rhinorrhea.     Right Sinus: No maxillary sinus tenderness or frontal sinus tenderness.     Left Sinus: No maxillary sinus tenderness or frontal sinus tenderness.     Mouth/Throat:     Lips: Pink.     Mouth: Mucous membranes are moist.     Pharynx: Uvula midline. No oropharyngeal exudate or posterior oropharyngeal erythema.     Tonsils: No tonsillar exudate.  Eyes:     General: Lids are normal. Lids are everted, no foreign bodies appreciated. Vision grossly intact.     Extraocular Movements: Extraocular movements intact.     Conjunctiva/sclera: Conjunctivae normal.     Pupils: Pupils are equal, round, and reactive to light.     Comments: Photosensitivity  Cardiovascular:     Rate and Rhythm: Normal rate and regular rhythm.     Heart sounds: S1 normal and S2 normal. No murmur heard. Pulmonary:     Effort: Pulmonary effort is normal. No respiratory distress.     Breath sounds: Normal breath sounds. No decreased breath sounds, wheezing, rhonchi or rales.  Abdominal:     General: Bowel sounds are normal.     Palpations: Abdomen is soft.     Tenderness: There is no abdominal tenderness.  Musculoskeletal:        General: No swelling.     Cervical back: Neck  supple.  Lymphadenopathy:     Head:     Right side of head: No  submental, submandibular, tonsillar, preauricular or posterior auricular adenopathy.     Left side of head: No submental, submandibular, tonsillar, preauricular or posterior auricular adenopathy.     Cervical: No cervical adenopathy.     Right cervical: No superficial cervical adenopathy.    Left cervical: No superficial cervical adenopathy.  Skin:    General: Skin is warm and dry.     Capillary Refill: Capillary refill takes less than 2 seconds.     Findings: No rash.  Neurological:     Mental Status: She is alert and oriented to person, place, and time.     Cranial Nerves: Cranial nerves 2-12 are intact.     Sensory: Sensation is intact.     Motor: Motor function is intact.     Coordination: Coordination is intact.     Gait: Gait is intact.  Psychiatric:        Mood and Affect: Mood normal.      UC Treatments / Results  Labs (all labs ordered are listed, but only abnormal results are displayed) Labs Reviewed - No data to display  EKG   Radiology No results found.  Procedures Procedures (including critical care time)  Medications Ordered in UC Medications  ketorolac  (TORADOL ) 30 MG/ML injection 30 mg (30 mg Intramuscular Given 09/27/24 1117)    Initial Impression / Assessment and Plan / UC Course  I have reviewed the triage vital signs and the nursing notes.  Pertinent labs & imaging results that were available during my care of the patient were reviewed by me and considered in my medical decision making (see chart for details).  Plan of Care (see discharge instructions for additional patient precautions and education): Migraine headache with nausea: Ketorolac  30 mg injection now.  Ketorolac  10 mg every 6 hours if needed for headache.  Patient has sumatriptan hand to use if needed.  Promethazine , 12.5 mg, 1 to 2 pills every 8 hours if needed for nausea.  Patient was cautioned that promethazine  can be  sedating and she should be cautious about driving after use.  Provided updated education on migraines including food migraine triggers and nonfood migraine triggers and a migraine diary.  Encouraged to get connected with primary care as she may need to start on some kind of migraine preventative medicine.  See discharge instruction for signs and symptoms of worsening condition that indicate a need to go to an emergency room.  Follow-up with primary care here if needed.  I reviewed the plan of care with the patient and/or the patient's guardian.  The patient and/or guardian had time to ask questions and acknowledged that the questions were answered.  Final Clinical Impressions(s) / UC Diagnoses   Final diagnoses:  Nausea without vomiting  Other migraine without status migrainosus, intractable     Discharge Instructions      Migraine headache with nausea: Ketorolac  30 mg injection now.  Ketorolac  10 mg every 6 hours if needed for headache.  Patient has sumatriptan hand to use if needed.  Promethazine , 12.5 mg, 1 to 2 pills every 8 hours if needed for nausea.  Patient was cautioned that promethazine  can be sedating and she should be cautious about driving after use.  Provided updated education on migraines including food migraine triggers and nonfood migraine triggers and a migraine diary.  Encouraged to get connected with primary care as she may need to start on some kind of migraine preventative medicine.  See below for signs and symptoms of worsening condition that indicate a need  to go to an emergency room.  Follow-up with primary care here if needed.  Get help right away if: Your migraine headache becomes severe or lasts more than 72 hours. You have a fever or stiff neck. You have vision loss. Your muscles feel weak or like you cannot control them. You lose your balance often or have trouble walking. You faint. You have a seizure. This information is not intended to replace advice  given to you by your health care provider. Make sure you discuss any questions you have with your health care provider.     ED Prescriptions     Medication Sig Dispense Auth. Provider   ketorolac  (TORADOL ) 10 MG tablet Take 1 tablet (10 mg total) by mouth every 6 (six) hours as needed. 20 tablet Camey Edell, FNP   promethazine  (PHENERGAN ) 12.5 MG tablet Take 1 to 2 pills (12.5 to 25 mg) every 8 hours as needed for nausea or vomiting. 30 tablet Bridgette Wolden, FNP      PDMP not reviewed this encounter.    [1]  Social History Tobacco Use   Smoking status: Never   Smokeless tobacco: Never  Vaping Use   Vaping status: Never Used  Substance Use Topics   Alcohol use: No   Drug use: No     Ival Domino, FNP 09/27/24 1120  "

## 2024-09-27 NOTE — ED Triage Notes (Signed)
 Pt reports migraine x 1 week started last Sunday. Pt has taken Imitrex it helps the pain some but it comes right back.
